# Patient Record
Sex: Female | Born: 1965 | Hispanic: No | Marital: Single | Smoking: Never smoker
Health system: Southern US, Community
[De-identification: ages and names within clinical notes are randomized; demographics above are authoritative.]

## PROBLEM LIST (undated history)

## (undated) DIAGNOSIS — Q619 Cystic kidney disease, unspecified: Secondary | ICD-10-CM

## (undated) DIAGNOSIS — Z87442 Personal history of urinary calculi: Secondary | ICD-10-CM

## (undated) DIAGNOSIS — D649 Anemia, unspecified: Secondary | ICD-10-CM

## (undated) DIAGNOSIS — R3129 Other microscopic hematuria: Secondary | ICD-10-CM

## (undated) HISTORY — DX: Cystic kidney disease, unspecified: Q61.9

## (undated) HISTORY — DX: Other microscopic hematuria: R31.29

## (undated) HISTORY — PX: COLONOSCOPY: SHX174

---

## 2002-10-29 ENCOUNTER — Other Ambulatory Visit: Admission: RE | Admit: 2002-10-29 | Discharge: 2002-10-29 | Payer: Self-pay | Admitting: Internal Medicine

## 2004-12-18 ENCOUNTER — Encounter: Admission: RE | Admit: 2004-12-18 | Discharge: 2004-12-18 | Payer: Self-pay | Admitting: Internal Medicine

## 2007-04-23 ENCOUNTER — Encounter: Payer: Self-pay | Admitting: Internal Medicine

## 2007-04-30 ENCOUNTER — Ambulatory Visit: Payer: Self-pay | Admitting: Internal Medicine

## 2007-05-08 ENCOUNTER — Ambulatory Visit: Payer: Self-pay | Admitting: Internal Medicine

## 2007-05-08 ENCOUNTER — Encounter: Payer: Self-pay | Admitting: Internal Medicine

## 2007-05-08 ENCOUNTER — Other Ambulatory Visit: Admission: RE | Admit: 2007-05-08 | Discharge: 2007-05-08 | Payer: Self-pay | Admitting: Internal Medicine

## 2007-05-08 DIAGNOSIS — R3129 Other microscopic hematuria: Secondary | ICD-10-CM

## 2007-05-08 LAB — CONVERTED CEMR LAB
Bilirubin Urine: NEGATIVE
Glucose, Urine, Semiquant: NEGATIVE
Ketones, urine, test strip: NEGATIVE
Nitrite: NEGATIVE
Protein, U semiquant: NEGATIVE
Urobilinogen, UA: NEGATIVE
WBC Urine, dipstick: NEGATIVE

## 2007-06-01 ENCOUNTER — Ambulatory Visit: Payer: Self-pay | Admitting: Internal Medicine

## 2007-06-01 ENCOUNTER — Telehealth: Payer: Self-pay | Admitting: Internal Medicine

## 2007-06-01 DIAGNOSIS — N390 Urinary tract infection, site not specified: Secondary | ICD-10-CM

## 2007-06-01 LAB — CONVERTED CEMR LAB
Bilirubin Urine: NEGATIVE
Glucose, Urine, Semiquant: NEGATIVE
Ketones, urine, test strip: NEGATIVE
Nitrite: NEGATIVE
Protein, U semiquant: NEGATIVE
Specific Gravity, Urine: 1.01
Urobilinogen, UA: 0.2
WBC Urine, dipstick: NEGATIVE
pH: 5.5

## 2007-06-11 ENCOUNTER — Telehealth: Payer: Self-pay | Admitting: *Deleted

## 2007-06-12 ENCOUNTER — Encounter: Payer: Self-pay | Admitting: Internal Medicine

## 2007-11-04 ENCOUNTER — Encounter: Admission: RE | Admit: 2007-11-04 | Discharge: 2007-11-04 | Payer: Self-pay | Admitting: Internal Medicine

## 2008-04-04 ENCOUNTER — Encounter: Payer: Self-pay | Admitting: Family Medicine

## 2008-04-05 ENCOUNTER — Ambulatory Visit: Payer: Self-pay | Admitting: Family Medicine

## 2008-04-05 DIAGNOSIS — Z9189 Other specified personal risk factors, not elsewhere classified: Secondary | ICD-10-CM

## 2008-08-01 ENCOUNTER — Ambulatory Visit: Payer: Self-pay | Admitting: Internal Medicine

## 2008-08-01 LAB — CONVERTED CEMR LAB
ALT: 22 units/L (ref 0–35)
AST: 30 units/L (ref 0–37)
Albumin: 3.9 g/dL (ref 3.5–5.2)
Alkaline Phosphatase: 45 units/L (ref 39–117)
BUN: 12 mg/dL (ref 6–23)
Basophils Absolute: 0.1 10*3/uL (ref 0.0–0.1)
Basophils Relative: 0.7 % (ref 0.0–3.0)
Bilirubin Urine: NEGATIVE
Bilirubin, Direct: 0.1 mg/dL (ref 0.0–0.3)
CO2: 28 meq/L (ref 19–32)
Calcium: 9.1 mg/dL (ref 8.4–10.5)
Chloride: 106 meq/L (ref 96–112)
Cholesterol: 176 mg/dL (ref 0–200)
Creatinine, Ser: 0.7 mg/dL (ref 0.4–1.2)
Eosinophils Absolute: 0.3 10*3/uL (ref 0.0–0.7)
Eosinophils Relative: 2.7 % (ref 0.0–5.0)
GFR calc non Af Amer: 96.55 mL/min (ref >60)
Glucose, Bld: 81 mg/dL (ref 70–99)
Glucose, Urine, Semiquant: NEGATIVE
HCT: 34.7 % — ABNORMAL LOW (ref 36.0–46.0)
HDL: 48.6 mg/dL (ref >39.00)
Hemoglobin: 12 g/dL (ref 12.0–15.0)
Ketones, urine, test strip: NEGATIVE
LDL Cholesterol: 106 mg/dL — ABNORMAL HIGH (ref 0–99)
Lymphocytes Relative: 18.2 % (ref 12.0–46.0)
Lymphs Abs: 1.7 10*3/uL (ref 0.7–4.0)
MCHC: 34.7 g/dL (ref 30.0–36.0)
MCV: 86.4 fL (ref 78.0–100.0)
Monocytes Absolute: 0.5 10*3/uL (ref 0.1–1.0)
Monocytes Relative: 5.4 % (ref 3.0–12.0)
Neutro Abs: 6.7 10*3/uL (ref 1.4–7.7)
Neutrophils Relative %: 73 % (ref 43.0–77.0)
Nitrite: NEGATIVE
Platelets: 292 10*3/uL (ref 150.0–400.0)
Potassium: 4.3 meq/L (ref 3.5–5.1)
Protein, U semiquant: NEGATIVE
RBC: 4.02 M/uL (ref 3.87–5.11)
RDW: 12.3 % (ref 11.5–14.6)
Sodium: 142 meq/L (ref 135–145)
Specific Gravity, Urine: <1.005
TSH: 1.68 microintl units/mL (ref 0.35–5.50)
Total Bilirubin: 0.8 mg/dL (ref 0.3–1.2)
Total CHOL/HDL Ratio: 4
Total Protein: 7.4 g/dL (ref 6.0–8.3)
Triglycerides: 105 mg/dL (ref 0.0–149.0)
Urobilinogen, UA: 0.2
VLDL: 21 mg/dL (ref 0.0–40.0)
WBC Urine, dipstick: NEGATIVE
WBC: 9.3 10*3/uL (ref 4.5–10.5)
pH: 5

## 2008-08-15 ENCOUNTER — Encounter: Payer: Self-pay | Admitting: Internal Medicine

## 2008-08-16 ENCOUNTER — Ambulatory Visit: Payer: Self-pay | Admitting: Internal Medicine

## 2008-08-16 DIAGNOSIS — E669 Obesity, unspecified: Secondary | ICD-10-CM | POA: Insufficient documentation

## 2008-08-16 LAB — CONVERTED CEMR LAB
Bilirubin Urine: NEGATIVE
Glucose, Urine, Semiquant: NEGATIVE
Ketones, urine, test strip: NEGATIVE
Nitrite: NEGATIVE
Protein, U semiquant: NEGATIVE
Specific Gravity, Urine: <1.005
Urobilinogen, UA: 0.2
WBC Urine, dipstick: NEGATIVE
pH: 6.5

## 2008-11-29 ENCOUNTER — Encounter: Admission: RE | Admit: 2008-11-29 | Discharge: 2008-11-29 | Payer: Self-pay | Admitting: Internal Medicine

## 2009-08-14 ENCOUNTER — Ambulatory Visit: Payer: Self-pay | Admitting: Internal Medicine

## 2009-08-14 LAB — CONVERTED CEMR LAB
ALT: 19 units/L (ref 0–35)
AST: 19 units/L (ref 0–37)
Albumin: 4.2 g/dL (ref 3.5–5.2)
Alkaline Phosphatase: 50 units/L (ref 39–117)
BUN: 20 mg/dL (ref 6–23)
Basophils Absolute: 0 10*3/uL (ref 0.0–0.1)
Basophils Relative: 0.6 % (ref 0.0–3.0)
Bilirubin Urine: NEGATIVE
Bilirubin, Direct: 0 mg/dL (ref 0.0–0.3)
CO2: 28 meq/L (ref 19–32)
Calcium: 9.5 mg/dL (ref 8.4–10.5)
Chloride: 107 meq/L (ref 96–112)
Cholesterol: 201 mg/dL — ABNORMAL HIGH (ref 0–200)
Creatinine, Ser: 0.6 mg/dL (ref 0.4–1.2)
Direct LDL: 133.1 mg/dL
Eosinophils Absolute: 0.2 10*3/uL (ref 0.0–0.7)
Eosinophils Relative: 3.2 % (ref 0.0–5.0)
GFR calc non Af Amer: 110.54 mL/min (ref >60)
Glucose, Bld: 87 mg/dL (ref 70–99)
HCT: 36.5 % (ref 36.0–46.0)
HDL: 51.3 mg/dL (ref >39.00)
Hemoglobin: 12.7 g/dL (ref 12.0–15.0)
Ketones, ur: NEGATIVE mg/dL
Leukocytes, UA: NEGATIVE
Lymphocytes Relative: 30.8 % (ref 12.0–46.0)
Lymphs Abs: 2.2 10*3/uL (ref 0.7–4.0)
MCHC: 34.9 g/dL (ref 30.0–36.0)
MCV: 85.5 fL (ref 78.0–100.0)
Monocytes Absolute: 0.4 10*3/uL (ref 0.1–1.0)
Monocytes Relative: 5.6 % (ref 3.0–12.0)
Neutro Abs: 4.2 10*3/uL (ref 1.4–7.7)
Neutrophils Relative %: 59.8 % (ref 43.0–77.0)
Nitrite: NEGATIVE
Platelets: 285 10*3/uL (ref 150.0–400.0)
Potassium: 4.6 meq/L (ref 3.5–5.1)
RBC: 4.26 M/uL (ref 3.87–5.11)
RDW: 13 % (ref 11.5–14.6)
Sodium: 139 meq/L (ref 135–145)
Specific Gravity, Urine: 1.01 (ref 1.000–1.030)
TSH: 1.37 microintl units/mL (ref 0.35–5.50)
Total Bilirubin: 0.6 mg/dL (ref 0.3–1.2)
Total CHOL/HDL Ratio: 4
Total Protein, Urine: NEGATIVE mg/dL
Total Protein: 7.4 g/dL (ref 6.0–8.3)
Triglycerides: 81 mg/dL (ref 0.0–149.0)
Urine Glucose: NEGATIVE mg/dL
Urobilinogen, UA: 0.2 (ref 0.0–1.0)
VLDL: 16.2 mg/dL (ref 0.0–40.0)
WBC: 7 10*3/uL (ref 4.5–10.5)
pH: 5.5 (ref 5.0–8.0)

## 2009-08-21 ENCOUNTER — Ambulatory Visit: Payer: Self-pay | Admitting: Internal Medicine

## 2009-08-21 ENCOUNTER — Other Ambulatory Visit: Admission: RE | Admit: 2009-08-21 | Discharge: 2009-08-21 | Payer: Self-pay | Admitting: Internal Medicine

## 2009-08-21 DIAGNOSIS — N281 Cyst of kidney, acquired: Secondary | ICD-10-CM | POA: Insufficient documentation

## 2009-08-21 LAB — HM PAP SMEAR

## 2009-08-24 LAB — CONVERTED CEMR LAB: Pap Smear: NEGATIVE

## 2009-10-05 ENCOUNTER — Encounter: Payer: Self-pay | Admitting: Internal Medicine

## 2009-12-08 ENCOUNTER — Encounter: Admission: RE | Admit: 2009-12-08 | Discharge: 2009-12-08 | Payer: Self-pay | Admitting: Internal Medicine

## 2009-12-08 LAB — HM MAMMOGRAPHY

## 2010-03-04 LAB — CONVERTED CEMR LAB
Bilirubin Urine: NEGATIVE
Glucose, Urine, Semiquant: NEGATIVE
Ketones, urine, test strip: NEGATIVE
Nitrite: NEGATIVE
Specific Gravity, Urine: 1.025
Urobilinogen, UA: 0.2
WBC Urine, dipstick: NEGATIVE
pH: 5.5

## 2010-03-06 NOTE — Assessment & Plan Note (Signed)
Summary: cpx/pap/njr   Vital Signs:  Patient profile:   45 year old female Menstrual status:  regular LMP:     08/07/2009 Height:      60.5 inches Weight:      206 pounds BMI:     39.71 Pulse rate:   72 / minute BP sitting:   100 / 70  (left arm) Cuff size:   large  Vitals Entered By: Romualdo Bolk, CMA (AAMA) (August 21, 2009 10:09 AM) CC: CPX with pap LMP (date): 08/07/2009 LMP - Character: normal Menarche (age onset years): 12   Menses interval (days): 28 Menstrual flow (days): 3-4 Enter LMP: 08/07/2009   History of Present Illness: Carrie Caldwell comes in today  for preventive visit . Since last visit  here  there have been no major changes in health status  . No ed visit s. Due for check with Urology [facility] ? should she go. No signs and no gross hematuria.   Preventive Care Screening  Prior Values:    Mammogram:  ASSESSMENT: Negative - BI-RADS 1^MM DIGITAL SCREENING (11/29/2008)    Last Tetanus Booster:  Tdap (05/08/2007)   Preventive Screening-Counseling & Management  Alcohol-Tobacco     Alcohol drinks/day: <1     Alcohol type: wine     Smoking Status: never  Caffeine-Diet-Exercise     Caffeine use/day: 2     Does Patient Exercise: yes     Type of exercise: eliptical and wts     Times/week: 5  Hep-HIV-STD-Contraception     Dental Visit-last 6 months yes     Sun Exposure-Excessive: no  Safety-Violence-Falls     Seat Belt Use: yes     Smoke Detectors: yes  Current Medications (verified): 1)  None  Allergies (verified): No Known Drug Allergies  Past History:  Past medical, surgical, family and social histories (including risk factors) reviewed, and no changes noted (except as noted below).  Past Medical History: Unremarkable primiiparous Microscopic hematuria  Cystic kidney  left   Past Surgical History: Reviewed history from 05/08/2007 and no changes required. Denies surgical history  Past History:  Care Management: Urology:  [facility]  Family History: Reviewed history from 05/08/2007 and no changes required. Family Hx of CAD and DM  kidney stones  otherwise no hematuria  Social History: Reviewed history from 08/16/2008 and no changes required. 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 exercises every am.5 hours of sleep hh of 2  mom and her  pet outside dogs.  2 dogs  Review of Systems  The patient denies anorexia, fever, weight loss, weight gain, vision loss, decreased hearing, hoarseness, chest pain, syncope, dyspnea on exertion, peripheral edema, prolonged cough, headaches, hemoptysis, abdominal pain, melena, hematochezia, severe indigestion/heartburn, incontinence, genital sores, muscle weakness, suspicious skin lesions, transient blindness, difficulty walking, depression, unusual weight change, abnormal bleeding, enlarged lymph nodes, angioedema, and breast masses.         periods  sometimes q  3 weeks nl length .   Physical Exam General Appearance: well developed, well nourished, no acute distress Eyes: conjunctiva and lids normal, PERRLA, EOMI,  WNL Ears, Nose, Mouth, Throat: TM clear, nares clear, oral exam WNL Neck: supple, no lymphadenopathy, no thyromegaly, no JVD Respiratory: clear to auscultation and percussion, respiratory effort normal Cardiovascular: regular rate and rhythm, S1-S2, no murmur, rub or gallop, no bruits, peripheral pulses normal and symmetric, no cyanosis, clubbing, edema or varicosities Chest: no scars, masses,  tenderness; no asymmetry, skin changes, nipple discharge   Gastrointestinal: soft, non-tender; no hepatosplenomegaly, masses; active bowel sounds all quadrants, guaiac negative stool; no masses, tenderness, hemorrhoids  Genitourinary: no vaginal discharge, lesions; no masses or tenderness  PAP done  Lymphatic: no cervical, axillary or inguinal adenopathy Musculoskeletal: gait  normal, muscle tone and strength WNL, no joint swelling, effusions, discoloration, crepitus  Skin: clear, good turgor, color WNL, no rashes, lesions, or ulcerations Neurologic: normal mental status, normal reflexes, normal strength, sensation, and motion Psychiatric: alert; oriented to person, place and time Other Exam:   labs reviewed     Impression & Recommendations:  Problem # 1:  PREVENTIVE HEALTH CARE (ICD-V70.0) Discussed nutrition,exercise,diet,healthy weight, vitamin D and calcium.   Problem # 2:  ROUTINE GYNECOLOGICAL EXAM (ICD-V72.31)  nl exam  pap done   Orders: Pap Smear, Thin Prep ( Collection of) ([id number])  Problem # 3:  OBESITY (ICD-278.00)  counseled   famil hx of dm   Ht: 60.5 (08/21/2009)   Wt: 206 (08/21/2009)   BMI: 39.71 (08/21/2009)  Problem # 4:  MICROSCOPIC HEMATURIA (ICD-599.72) continued and would follow up with  urology  .   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  3)  See urology  about follow up of hematuria .Marland KitchenMarland Kitchen

## 2010-03-06 NOTE — Letter (Signed)
Summary: [facility]  [facility]   Imported By: Maryln Gottron 10/17/2009 11:27:40  _____________________________________________________________________  External Attachment:    Type:   Image     Comment:   External Document

## 2010-11-01 ENCOUNTER — Other Ambulatory Visit (HOSPITAL_COMMUNITY)
Admission: RE | Admit: 2010-11-01 | Discharge: 2010-11-01 | Disposition: A | Discharge status: Home / Self Care | Payer: BC Managed Care – PPO | Source: Ambulatory Visit | Attending: Family Medicine | Admitting: Family Medicine

## 2010-11-01 ENCOUNTER — Ambulatory Visit (INDEPENDENT_AMBULATORY_CARE_PROVIDER_SITE_OTHER): Payer: BC Managed Care – PPO | Admitting: Family Medicine

## 2010-11-01 ENCOUNTER — Encounter: Payer: Self-pay | Admitting: Family Medicine

## 2010-11-01 ENCOUNTER — Telehealth: Payer: Self-pay | Admitting: Internal Medicine

## 2010-11-01 VITALS — BP 110/78 | Temp 98.3°F | Wt 185.0 lb

## 2010-11-01 DIAGNOSIS — N939 Abnormal uterine and vaginal bleeding, unspecified: Secondary | ICD-10-CM

## 2010-11-01 DIAGNOSIS — Z01419 Encounter for gynecological examination (general) (routine) without abnormal findings: Secondary | ICD-10-CM | POA: Insufficient documentation

## 2010-11-01 DIAGNOSIS — N898 Other specified noninflammatory disorders of vagina: Secondary | ICD-10-CM

## 2010-11-01 DIAGNOSIS — R319 Hematuria, unspecified: Secondary | ICD-10-CM

## 2010-11-01 DIAGNOSIS — N888 Other specified noninflammatory disorders of cervix uteri: Secondary | ICD-10-CM

## 2010-11-01 LAB — POCT URINALYSIS DIPSTICK
Bilirubin, UA: NEGATIVE
Glucose, UA: NEGATIVE
Ketones, UA: NEGATIVE
Leukocytes, UA: NEGATIVE
Nitrite, UA: NEGATIVE
Protein, UA: NEGATIVE
Spec Grav, UA: 1.01
Urobilinogen, UA: 0.2
pH, UA: 6

## 2010-11-01 NOTE — Progress Notes (Signed)
  Subjective:    Patient ID: Carrie Caldwell, female    DOB: Apr 01, 1965, 45 y.o.   MRN: [id number]  HPI Patient seen as a work in with some vaginal bleeding with wiping-after urination. She had end of last menstrual cycle Monday and has not had any vaginal bleeding otherwise. Denies rectal bleeding. She has a long history of microscopic hematuria and is seeing urologist with full workup. She's had previous cystic lesions left kidney followed yearly by urology.  No gross hematuria.  No history of abnormal Pap smears. Last Pap smear was over one year ago. Denies any appetite or weight changes. No vaginal discharge. Bright blood with wiping after urination today. No other bleeding complications.  Past Medical History  Diagnosis Date  . Microscopic hematuria   . Cystic kidney disease     left   No past surgical history on file.  reports that she has never smoked. She does not have any smokeless tobacco history on file. Her alcohol and drug histories not on file. family history includes Coronary artery disease in her other; Diabetes in her other; Hematuria in her other; and Kidney disease in her other. No Known Allergies    Review of Systems  Constitutional: Negative for fever, chills, appetite change and unexpected weight change.  Respiratory: Negative for shortness of breath.   Cardiovascular: Negative for chest pain.  Gastrointestinal: Negative for abdominal pain.  Genitourinary: Positive for vaginal bleeding. Negative for dysuria, hematuria, vaginal discharge, vaginal pain and pelvic pain.  Neurological: Negative for dizziness.       Objective:   Physical Exam  Constitutional: She appears well-developed and well-nourished.  Cardiovascular: Normal rate and regular rhythm.   Pulmonary/Chest: Effort normal and breath sounds normal. No respiratory distress. She has no wheezes. She has no rales.  Genitourinary:       Normal external genitalia. No evidence for active bleeding, external  trauma, or  external skin lesions.  Cervix exam reveals that she has nodular somewhat vascular appearing lesion which is approximately 1 cm diameter around 11 to 12:00 position of cervix. No endocervical polyps are noted. No active bleeding at this time. Pap smear obtained          Assessment & Plan:  Abnormal vaginal bleeding. She's had light spotting with wiping only at this point. Exam reveals vascular appearing nodular lesion superior aspect of the ectocervix-? Hemangiomatous.  Pap smear sent. GYN referral.

## 2010-11-01 NOTE — Telephone Encounter (Signed)
Pt to come in today for ov.

## 2010-11-01 NOTE — Telephone Encounter (Signed)
Pt is still having blood in urine pt said this is a reoccurring issue she has had for year it is just more frequent now. Pt received a notice for go back for a follow up ultra sound. Pt wanted to know if she should wait and go to this appt or come see Dr. Fabian Sharp.  Pt is requesting you contact her.

## 2010-11-07 NOTE — Progress Notes (Signed)
Quick Note:  Pt informed ______ 

## 2010-11-27 ENCOUNTER — Other Ambulatory Visit: Payer: Self-pay | Admitting: Internal Medicine

## 2010-11-27 DIAGNOSIS — Z1231 Encounter for screening mammogram for malignant neoplasm of breast: Secondary | ICD-10-CM

## 2010-12-10 ENCOUNTER — Ambulatory Visit
Admission: RE | Admit: 2010-12-10 | Discharge: 2010-12-10 | Disposition: A | Discharge status: Home / Self Care | Payer: BC Managed Care – PPO | Source: Ambulatory Visit | Attending: Internal Medicine | Admitting: Internal Medicine

## 2010-12-10 DIAGNOSIS — Z1231 Encounter for screening mammogram for malignant neoplasm of breast: Secondary | ICD-10-CM

## 2011-04-22 ENCOUNTER — Ambulatory Visit (INDEPENDENT_AMBULATORY_CARE_PROVIDER_SITE_OTHER): Payer: BC Managed Care – PPO | Admitting: Internal Medicine

## 2011-04-22 ENCOUNTER — Encounter: Payer: Self-pay | Admitting: Internal Medicine

## 2011-04-22 VITALS — BP 100/70 | HR 65 | Temp 98.0°F | Wt 172.0 lb

## 2011-04-22 DIAGNOSIS — J069 Acute upper respiratory infection, unspecified: Secondary | ICD-10-CM

## 2011-04-22 DIAGNOSIS — E669 Obesity, unspecified: Secondary | ICD-10-CM

## 2011-04-22 DIAGNOSIS — H6590 Unspecified nonsuppurative otitis media, unspecified ear: Secondary | ICD-10-CM

## 2011-04-22 MED ORDER — AMOXICILLIN 875 MG PO TABS: 875.0000 mg | ORAL_TABLET | Freq: Two times a day (BID) | ORAL | Status: AC

## 2011-04-22 NOTE — Progress Notes (Signed)
  Subjective:    Patient ID: Carrie Caldwell, female    DOB: [birth year], 46 y.o.   MRN: [id number]  HPI Patient comes in today for SDA for  new problem evaluation. Glenford Peers about 2 weeks ago  Then developed ear pain a few days ago  and hard to hear   Out of left ear .  Has used Tylenol advil cold ;  Drops otc and no help ;   Cough at night  No fever.  no SOB no hx of ear problems    Review of Systems Cough hs im sleep  No ha vision changes  Nasal drainage  Vomiting syncope sob .   Has been losing weight over the last year. No more hematuria  ? Didn't fu with the urologist but doing ok.   Past history family history social history reviewed in the electronic medical record.     Objective:   Physical Exam wdwn in nad BP 100/70  Pulse 65  Temp(Src) 98 F (36.7 C) (Oral)  Wt 172 lb (78.019 kg)  SpO2 97%  LMP 03/08/2011 HEENT: Normocephalic ;atraumatic , Eyes;  PERRL, EOMs  Full, lids and conjunctiva clear,,Ears: no deformities, canals nl, TM landmarks normal on right   LEFT  TM splayed light reflex    Fluid dusky and bulging inferior   Bones are ok  eac nl , Nose: no deformity or discharge  Mouth : OP clear without lesion or edema . Neck: Supple without adenopathy or masses or bruits Chest:  Clear to A&P without wheezes rales or rhonchi CV:  S1-S2 no gallops or murmurs peripheral perfusion is normal Chest roundish  Cartilage feeling left lower rib cage  ? Cartilage   Wt Readings from Last 3 Encounters:  04/22/11 172 lb (78.019 kg)  11/01/10 185 lb (83.915 kg)  08/21/09 206 lb (93.441 kg)       Assessment & Plan:  Acute uri with secondary Om with effusion.   Counseled.   Expectant management. Reasonable to use antibiotic at this time . Fu if  persistent or progressive   congrats on weight loss    Continue   reviewed other HCM parameters and is UTD

## 2011-04-22 NOTE — Patient Instructions (Signed)
You have had an ear infection and fluid in the middle ear space makes her hearing decrease. This should   Get better over weeks.You may benefit from antibiotic treatment if not getting better .   Decongestants in the day. You do not have was in your ear. .  I f  not hearing right in another 2 - 3 weeks call or if worse.

## 2011-04-27 DIAGNOSIS — H6590 Unspecified nonsuppurative otitis media, unspecified ear: Secondary | ICD-10-CM | POA: Insufficient documentation

## 2011-12-11 ENCOUNTER — Other Ambulatory Visit: Payer: Self-pay | Admitting: Internal Medicine

## 2011-12-11 DIAGNOSIS — Z1231 Encounter for screening mammogram for malignant neoplasm of breast: Secondary | ICD-10-CM

## 2012-01-21 ENCOUNTER — Ambulatory Visit
Admission: RE | Admit: 2012-01-21 | Discharge: 2012-01-21 | Disposition: A | Discharge status: Home / Self Care | Payer: BC Managed Care – PPO | Source: Ambulatory Visit | Attending: Internal Medicine | Admitting: Internal Medicine

## 2012-01-21 DIAGNOSIS — Z1231 Encounter for screening mammogram for malignant neoplasm of breast: Secondary | ICD-10-CM

## 2013-12-07 ENCOUNTER — Other Ambulatory Visit: Payer: Self-pay

## 2013-12-07 DIAGNOSIS — Z1231 Encounter for screening mammogram for malignant neoplasm of breast: Secondary | ICD-10-CM

## 2013-12-21 ENCOUNTER — Encounter (INDEPENDENT_AMBULATORY_CARE_PROVIDER_SITE_OTHER): Payer: Self-pay

## 2013-12-21 ENCOUNTER — Ambulatory Visit
Admission: RE | Admit: 2013-12-21 | Discharge: 2013-12-21 | Disposition: A | Discharge status: Home / Self Care | Payer: BC Managed Care – PPO | Source: Ambulatory Visit

## 2013-12-21 DIAGNOSIS — Z1231 Encounter for screening mammogram for malignant neoplasm of breast: Secondary | ICD-10-CM

## 2014-11-09 ENCOUNTER — Ambulatory Visit: Payer: Self-pay | Admitting: Internal Medicine

## 2015-01-09 ENCOUNTER — Other Ambulatory Visit: Payer: Self-pay

## 2015-01-09 DIAGNOSIS — Z1231 Encounter for screening mammogram for malignant neoplasm of breast: Secondary | ICD-10-CM

## 2015-02-07 ENCOUNTER — Ambulatory Visit
Admission: RE | Admit: 2015-02-07 | Discharge: 2015-02-07 | Disposition: A | Discharge status: Home / Self Care | Payer: BLUE CROSS/BLUE SHIELD | Source: Ambulatory Visit

## 2015-02-07 DIAGNOSIS — Z1231 Encounter for screening mammogram for malignant neoplasm of breast: Secondary | ICD-10-CM

## 2016-02-26 ENCOUNTER — Other Ambulatory Visit (INDEPENDENT_AMBULATORY_CARE_PROVIDER_SITE_OTHER): Payer: Self-pay | Admitting: Orthopedic Surgery

## 2016-09-09 ENCOUNTER — Other Ambulatory Visit: Payer: Self-pay | Admitting: Family Medicine

## 2017-09-02 ENCOUNTER — Encounter (INDEPENDENT_AMBULATORY_CARE_PROVIDER_SITE_OTHER): Payer: Self-pay | Admitting: Orthopedic Surgery

## 2017-09-02 ENCOUNTER — Ambulatory Visit (INDEPENDENT_AMBULATORY_CARE_PROVIDER_SITE_OTHER): Payer: Self-pay

## 2017-09-02 ENCOUNTER — Ambulatory Visit (INDEPENDENT_AMBULATORY_CARE_PROVIDER_SITE_OTHER): Payer: BLUE CROSS/BLUE SHIELD | Admitting: Orthopedic Surgery

## 2017-09-02 VITALS — Ht 60.5 in | Wt 172.0 lb

## 2017-09-02 DIAGNOSIS — M5442 Lumbago with sciatica, left side: Secondary | ICD-10-CM

## 2017-09-02 MED ORDER — PREDNISONE 10 MG PO TABS: 20.0000 mg | ORAL_TABLET | Freq: Every day | ORAL | 0 refills | Status: DC

## 2017-09-02 NOTE — Addendum Note (Signed)
Addended by: Aldean BakerUDA, Elica Almas on: 09/02/2017 11:02 AM   Modules accepted: Level of Service

## 2017-09-02 NOTE — Progress Notes (Signed)
Office Visit Note   Patient: Carrie Caldwell           Date of Birth: 26-Mar-1965           MRN: [id number] Visit Date: 09/02/2017              Requested by: Madelin Headings, MD [address] [place], [place] [address] PCP: Eartha Inch, MD  Chief Complaint  Patient presents with  . Left Knee - Pain, Numbness  . Left Foot - Pain      HPI: Patient is a 52 year old woman who presents with over 6-week history of left buttocks pain radiating to the lateral aspect the left knee and also radiating down to the medial malleolus.  Patient states that the pain is worse with prolonged sitting.  Patient has used Tylenol or Advil for pain without resolution.  Assessment & Plan: Visit Diagnoses:  1. Acute left-sided low back pain with left-sided sciatica     Plan: We will start with a low-dose prednisone 20 mg with breakfast she will wean down to 10 mg as symptoms resolved and then take this every other day and then discontinue to wean off the prednisone.  If patient is still symptomatic will schedule an MRI scan to further evaluate the lumbar disc pathology with intentions of referral to Dr. Alvester Morin for epidural steroid injection.  Follow-Up Instructions: Return if symptoms worsen or fail to improve.   Ortho Exam  Patient is alert, oriented, no adenopathy, well-dressed, normal affect, normal respiratory effort. Examination patient has a normal gait.  She has no pain with range of motion of the hip knee or ankle.  There is good motor strength in all motor groups of the left lower extremity.  Patient has a negative straight leg raise on the left.  Imaging: Xr Lumbar Spine 2-3 Views  Result Date: 09/02/2017 2 view radiographs of the lumbar spine show some mild degenerative disc disease with some mild osteophytic bone spurring.   maintenance of the lumbar lordosis.  No images are attached to the encounter.  Labs: No results found for: HGBA1C, ESRSEDRATE, CRP, LABURIC, REPTSTATUS,  GRAMSTAIN, CULT, LABORGA   Lab Results  Component Value Date   ALBUMIN 4.2 08/14/2009   ALBUMIN 3.9 08/01/2008    Body mass index is 33.04 kg/m.  Orders:  Orders Placed This Encounter  Procedures  . XR Lumbar Spine 2-3 Views   Meds ordered this encounter  Medications  . predniSONE (DELTASONE) 10 MG tablet    Sig: Take 2 tablets (20 mg total) by mouth daily with breakfast.    Dispense:  60 tablet    Refill:  0     Procedures: No procedures performed  Clinical Data: No additional findings.  ROS:  All other systems negative, except as noted in the HPI. Review of Systems  Objective: Vital Signs: Ht 5' 0.5" (1.537 m)   Wt 172 lb (78 kg)   BMI 33.04 kg/m   Specialty Comments:  No specialty comments available.  PMFS History: Patient Active Problem List   Diagnosis Date Noted  . OME (otitis media with effusion) 04/27/2011  . RENAL CYST, LEFT 08/21/2009  . OBESITY 08/16/2008  . CHEST WALL PAIN, HX OF 04/05/2008  . MICROSCOPIC HEMATURIA 05/08/2007   Past Medical History:  Diagnosis Date  . Cystic kidney disease    left  . Microscopic hematuria     Family History  Problem Relation Age of Onset  . Diabetes Other   . Coronary  artery disease Other   . Kidney disease Other   . Hematuria Other     History reviewed. No pertinent surgical history. Social History   Occupational History  . Not on file  Tobacco Use  . Smoking status: Never Smoker  Substance and Sexual Activity  . Alcohol use: Not on file  . Drug use: Not on file  . Sexual activity: Not on file

## 2017-10-09 ENCOUNTER — Ambulatory Visit (INDEPENDENT_AMBULATORY_CARE_PROVIDER_SITE_OTHER): Payer: BLUE CROSS/BLUE SHIELD | Admitting: Orthopedic Surgery

## 2017-10-14 ENCOUNTER — Encounter (INDEPENDENT_AMBULATORY_CARE_PROVIDER_SITE_OTHER): Payer: Self-pay | Admitting: Orthopedic Surgery

## 2017-10-14 ENCOUNTER — Ambulatory Visit (INDEPENDENT_AMBULATORY_CARE_PROVIDER_SITE_OTHER): Payer: BLUE CROSS/BLUE SHIELD | Admitting: Orthopedic Surgery

## 2017-10-14 VITALS — Ht 60.0 in | Wt 172.0 lb

## 2017-10-14 DIAGNOSIS — M76822 Posterior tibial tendinitis, left leg: Secondary | ICD-10-CM

## 2017-10-14 DIAGNOSIS — M5442 Lumbago with sciatica, left side: Secondary | ICD-10-CM | POA: Diagnosis not present

## 2017-10-14 NOTE — Progress Notes (Signed)
Office Visit Note   Patient: Carrie Caldwell           Date of Birth: February 11, 1965           MRN: [id number] Visit Date: 10/14/2017              Requested by: Eartha Inch, MD [address] [place], [place] [address] PCP: Eartha Inch, MD  Chief Complaint  Patient presents with  . Lower Back - Follow-up      HPI: Patient is a 52 year old woman who has been having lower back pain as well as pain over the posterior tibial tendon on the left.  She states the prednisone taper has helped but she states is currently not helping as much as it was on the stronger dose.  She denies any weakness in the left lower extremity.  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  Assessment & Plan: Visit Diagnoses:  1. Acute left-sided low back pain with left-sided sciatica   2. Posterior tibial tendinitis, left leg     Plan: She was given felt pads to provide support for the posterior tibial tendon recommended sitting to minimize weightbearing on the lower extremity.  Her back pain does not have any radicular complaints at this time and will see if this resolves on its own.  Follow-Up Instructions: Return if symptoms worsen or fail to improve.   Ortho Exam  Patient is alert, oriented, no adenopathy, well-dressed, normal affect, normal respiratory effort. Patient has normal gait she can do a single limb heel raise but this reproduces her pain on the left she has pain to palpation over the posterior tibial tendon and has pain with resisted inversion is all reproduces the same symptoms.  She has good pulses good ankle good subtalar motion.  She has a negative straight leg raise and no focal motor weakness on the left lower extremity.  Imaging: No results found. No images are attached to the encounter.  Labs: No results found for: HGBA1C, ESRSEDRATE, CRP, LABURIC, REPTSTATUS, GRAMSTAIN, CULT, LABORGA   Lab Results  Component Value Date   ALBUMIN 4.2 08/14/2009   ALBUMIN 3.9 08/01/2008    Body mass index is 33.59 kg/m.  Orders:  No orders of the defined types were placed in this encounter.  No orders of the defined types were placed in this encounter.    Procedures: No procedures performed  Clinical Data: No additional findings.  ROS:  All other systems negative, except as noted in the HPI. Review of Systems  Objective: Vital Signs: Ht 5' (1.524 m)   Wt 172 lb (78 kg)   BMI 33.59 kg/m   Specialty Comments:  No specialty comments available.  PMFS History: Patient Active Problem List   Diagnosis Date Noted  . OME (otitis media with effusion) 04/27/2011  . RENAL CYST, LEFT 08/21/2009  . OBESITY 08/16/2008  . CHEST WALL PAIN, HX OF 04/05/2008  . MICROSCOPIC HEMATURIA 05/08/2007   Past Medical History:  Diagnosis Date  . Cystic kidney disease    left  . Microscopic hematuria     Family History  Problem Relation Age of Onset  . Diabetes Other   . Coronary artery disease Other   . Kidney disease Other   . Hematuria Other     History reviewed. No pertinent surgical history. Social History   Occupational History  . Not on file  Tobacco Use  . Smoking status: Never Smoker  .  Smokeless tobacco: Never Used  Substance and Sexual Activity  . Alcohol use: Not on file  . Drug use: Not on file  . Sexual activity: Not on file

## 2017-12-25 ENCOUNTER — Other Ambulatory Visit (INDEPENDENT_AMBULATORY_CARE_PROVIDER_SITE_OTHER): Payer: Self-pay

## 2017-12-25 MED ORDER — PREDNISONE 10 MG PO TABS: 20.0000 mg | ORAL_TABLET | Freq: Every day | ORAL | 0 refills | Status: DC

## 2018-07-20 ENCOUNTER — Ambulatory Visit (INDEPENDENT_AMBULATORY_CARE_PROVIDER_SITE_OTHER): Payer: BC Managed Care – PPO | Admitting: Orthopedic Surgery

## 2018-07-20 ENCOUNTER — Ambulatory Visit: Payer: Self-pay

## 2018-07-20 ENCOUNTER — Other Ambulatory Visit: Payer: Self-pay

## 2018-07-20 ENCOUNTER — Encounter: Payer: Self-pay | Admitting: Orthopedic Surgery

## 2018-07-20 VITALS — Ht 60.0 in | Wt 172.0 lb

## 2018-07-20 DIAGNOSIS — M79671 Pain in right foot: Secondary | ICD-10-CM

## 2018-07-20 DIAGNOSIS — L97511 Non-pressure chronic ulcer of other part of right foot limited to breakdown of skin: Secondary | ICD-10-CM

## 2018-07-20 DIAGNOSIS — M7989 Other specified soft tissue disorders: Secondary | ICD-10-CM

## 2018-07-20 DIAGNOSIS — B029 Zoster without complications: Secondary | ICD-10-CM

## 2018-07-20 DIAGNOSIS — R2240 Localized swelling, mass and lump, unspecified lower limb: Secondary | ICD-10-CM

## 2018-07-20 DIAGNOSIS — B019 Varicella without complication: Secondary | ICD-10-CM

## 2018-07-20 MED ORDER — VALACYCLOVIR HCL 1 G PO TABS: 1000.0000 mg | ORAL_TABLET | Freq: Three times a day (TID) | ORAL | 0 refills | Status: DC

## 2018-07-20 NOTE — Progress Notes (Signed)
Office Visit Note   Patient: Carrie Caldwell           Date of Birth: 1965/10/20           MRN: [id number] Visit Date: 07/20/2018              Requested by: Chesley Noon, MD [address] [place],   [address] PCP: Chesley Noon, MD  Chief Complaint  Patient presents with  . Right Foot - Pain      HPI: Patient is a 53 year old woman who presents with a 2-monthhistory of a blister and now ulcer on the dorsum of the right foot between the first and second metatarsal.  Patient states she initially went to dermatology she had the blister popped she states that there were pathology results that were negative.  She states that the ulcer is getting worse she has clear weeping drainage.  She is using antibiotic ointment on the ulcer.  Patient also has a 1 year history of dry cracked skin on her fingers.  Patient denies history of rheumatoid arthritis in her family.  Assessment & Plan: Visit Diagnoses:  1. Right foot pain   2. Non-pressure chronic ulcer of other part of right foot limited to breakdown of skin (HCC)   3. Varicella zoster   4. Mass of soft tissue of foot     Plan: Patient will wear the medical compression stockings directly against the skin she will stop the antibiotics and stop the antibiotic ointment.  We will obtain an MRI scan of the right foot to further evaluate the soft tissue mass and we will draw the rheumatologic screen.  Follow-Up Instructions: Return in about 1 week (around 07/27/2018).   Ortho Exam  Patient is alert, oriented, no adenopathy, well-dressed, normal affect, normal respiratory effort. On examination patient has a good dorsalis pedis pulse she does not have any venous stasis changes consistent with venous insufficiency.  Patient does have scleroderma like changes of the skin of her fingers  with dry cracked skin of her fingers.  There is no swelling of the PIP or DIP joints and no ulnar deviation across the MCP joints.  Examination  of the right foot she has no range of motion at the MTP joint consistent with the radiographic hallux rigidus.  Patient does have what appears to be a soft mobile soft tissue mass under the ulcer this measures 2 cm in diameter.  It does not appear to be connected to the EHL tendon or lesser tendons of her foot.  There is clear weeping edema she states that after she started wearing the sock the ulcerative base is improving.  There is no exposed bone or tendon no cellulitis.  There is a small area of dermatitis around the ulcer which is most likely due to the antibiotic ointment and the drainage.  The ulcerative area is extremely painful to touch.  Imaging: Xr Foot Complete Right  Result Date: 07/20/2018 3 view radiographs of the right foot shows hallux rigidus with joint space collapse of the great toe MTP joint.  No other bony abnormalities no soft tissue mass visible    Labs: No results found for: HGBA1C, ESRSEDRATE, CRP, LABURIC, REPTSTATUS, GRAMSTAIN, CULT, LABORGA   Lab Results  Component Value Date   ALBUMIN 4.2 08/14/2009   ALBUMIN 3.9 08/01/2008    Body mass index is 33.59 kg/m.  Orders:  Orders Placed This Encounter  Procedures  . XR Foot Complete Right  . Sed  Rate (ESR)  . Uric acid  . ANA  . Rheumatoid Factor   No orders of the defined types were placed in this encounter.    Procedures: No procedures performed  Clinical Data: No additional findings.  ROS:  All other systems negative, except as noted in the HPI. Review of Systems  Objective: Vital Signs: Ht 5' (1.524 m)   Wt 172 lb (78 kg)   BMI 33.59 kg/m   Specialty Comments:  No specialty comments available.  PMFS History: Patient Active Problem List   Diagnosis Date Noted  . Mass of soft tissue of foot 07/20/2018  . OME (otitis media with effusion) 04/27/2011  . RENAL CYST, LEFT 08/21/2009  . OBESITY 08/16/2008  . CHEST WALL PAIN, HX OF 04/05/2008  . MICROSCOPIC HEMATURIA 05/08/2007    Past Medical History:  Diagnosis Date  . Cystic kidney disease    left  . Microscopic hematuria     Family History  Problem Relation Age of Onset  . Diabetes Other   . Coronary artery disease Other   . Kidney disease Other   . Hematuria Other     History reviewed. No pertinent surgical history. Social History   Occupational History  . Not on file  Tobacco Use  . Smoking status: Never Smoker  . Smokeless tobacco: Never Used  Substance and Sexual Activity  . Alcohol use: Not on file  . Drug use: Not on file  . Sexual activity: Not on file

## 2018-07-21 ENCOUNTER — Telehealth: Payer: Self-pay | Admitting: Orthopedic Surgery

## 2018-07-21 ENCOUNTER — Other Ambulatory Visit: Payer: Self-pay | Admitting: Orthopedic Surgery

## 2018-07-21 LAB — URIC ACID: Uric Acid, Serum: 7.2 mg/dL — ABNORMAL HIGH (ref 2.5–7.0)

## 2018-07-21 LAB — SEDIMENTATION RATE: Sed Rate: 36 mm/h — ABNORMAL HIGH (ref 0–30)

## 2018-07-21 LAB — RHEUMATOID FACTOR: Rheumatoid fact SerPl-aCnc: <14 IU/mL (ref <14)

## 2018-07-21 NOTE — Telephone Encounter (Signed)
I called patient and informed her that her uric acid was 7.2.  Patient states that she does have kidney disease so colchicine would not be a good option.  Will give her samples of Uloric 40 mg daily.

## 2018-07-23 LAB — ANA: ANA Titer 1: NEGATIVE

## 2018-07-27 ENCOUNTER — Ambulatory Visit (INDEPENDENT_AMBULATORY_CARE_PROVIDER_SITE_OTHER): Payer: BC Managed Care – PPO | Admitting: Orthopedic Surgery

## 2018-07-27 ENCOUNTER — Telehealth: Payer: Self-pay | Admitting: Radiology

## 2018-07-27 ENCOUNTER — Other Ambulatory Visit: Payer: Self-pay

## 2018-07-27 ENCOUNTER — Encounter: Payer: Self-pay | Admitting: Orthopedic Surgery

## 2018-07-27 VITALS — Ht 60.0 in | Wt 172.0 lb

## 2018-07-27 DIAGNOSIS — L97511 Non-pressure chronic ulcer of other part of right foot limited to breakdown of skin: Secondary | ICD-10-CM

## 2018-07-27 NOTE — Telephone Encounter (Signed)
Patient's mother, Joycelyn Schmid, called with concerns about her daughter. She does not want her daughter to know that she is calling. She is concerned that the MRI is not being done quickly enough and wants to know if Dr. Sharol Given can get it done quicker. I advised imaging centers are trying to schedule new patient referrals as well as rescheduling all MRI's that were cancelled over the past 3 months due to COVID-19.  She expressed understanding, but would still like the message to be sent to Dr. Sharol Given to see if it can be done sooner.

## 2018-07-27 NOTE — Telephone Encounter (Signed)
Please see message below. Is there anyway we can call and move appt for MRI up or is this the first available?

## 2018-07-28 ENCOUNTER — Telehealth: Payer: Self-pay

## 2018-07-28 NOTE — Telephone Encounter (Signed)
I called Joycelyn Schmid back no answer left vm

## 2018-07-28 NOTE — Telephone Encounter (Signed)
I Called and left message to return my call 

## 2018-07-29 ENCOUNTER — Ambulatory Visit
Admission: RE | Admit: 2018-07-29 | Discharge: 2018-07-29 | Disposition: A | Discharge status: Home / Self Care | Payer: BC Managed Care – PPO | Source: Ambulatory Visit | Attending: Orthopedic Surgery | Admitting: Orthopedic Surgery

## 2018-07-29 ENCOUNTER — Other Ambulatory Visit: Payer: Self-pay

## 2018-07-29 DIAGNOSIS — M7989 Other specified soft tissue disorders: Secondary | ICD-10-CM

## 2018-07-30 ENCOUNTER — Encounter: Payer: Self-pay | Admitting: Orthopedic Surgery

## 2018-07-30 ENCOUNTER — Other Ambulatory Visit: Payer: BC Managed Care – PPO

## 2018-07-30 NOTE — Progress Notes (Signed)
   Office Visit Note   Patient: Carrie Caldwell           Date of Birth: 1965-07-15           MRN: [id number] Visit Date: 07/27/2018              Requested by: Chesley Noon, MD [address] [place],  [place] [address] PCP: Chesley Noon, MD  Chief Complaint  Patient presents with  . Right Foot - Pain, Follow-up      HPI: Patient presents in follow-up for a ulcer dorsum of the right foot.  Wound is slowly improving with the medical compression sock it is not healing as rapidly as would be expected.  Assessment & Plan: Visit Diagnoses:  1. Non-pressure chronic ulcer of other part of right foot limited to breakdown of skin [facility])     Plan: Patient is still scheduled for an MRI scan to further evaluate the soft tissues she will continue with the medical compression stocking.  Follow-Up Instructions: Return if symptoms worsen or fail to improve.   Ortho Exam  Patient is alert, oriented, no adenopathy, well-dressed, normal affect, normal respiratory effort. Examination the wound does look a little better.  Patient did not have much improvement with using tall tracks.  She did not have much improvement with using the Uloric.  The wound is still painful there is still clear drainage there is no cellulitis.  Imaging: No results found. No images are attached to the encounter.  Labs: Lab Results  Component Value Date   ESRSEDRATE 36 (H) 07/20/2018   LABURIC 7.2 (H) 07/20/2018     Lab Results  Component Value Date   ALBUMIN 4.2 08/14/2009   ALBUMIN 3.9 08/01/2008   LABURIC 7.2 (H) 07/20/2018    Body mass index is 33.59 kg/m.  Orders:  No orders of the defined types were placed in this encounter.  No orders of the defined types were placed in this encounter.    Procedures: No procedures performed  Clinical Data: No additional findings.  ROS:  All other systems negative, except as noted in the HPI. Review of Systems  Objective: Vital Signs: Ht 5'  (1.524 m)   Wt 172 lb (78 kg)   BMI 33.59 kg/m   Specialty Comments:  No specialty comments available.  PMFS History: Patient Active Problem List   Diagnosis Date Noted  . Mass of soft tissue of foot 07/20/2018  . OME (otitis media with effusion) 04/27/2011  . RENAL CYST, LEFT 08/21/2009  . OBESITY 08/16/2008  . CHEST WALL PAIN, HX OF 04/05/2008  . MICROSCOPIC HEMATURIA 05/08/2007   Past Medical History:  Diagnosis Date  . Cystic kidney disease    left  . Microscopic hematuria     Family History  Problem Relation Age of Onset  . Diabetes Other   . Coronary artery disease Other   . Kidney disease Other   . Hematuria Other     History reviewed. No pertinent surgical history. Social History   Occupational History  . Not on file  Tobacco Use  . Smoking status: Never Smoker  . Smokeless tobacco: Never Used  Substance and Sexual Activity  . Alcohol use: Not on file  . Drug use: Not on file  . Sexual activity: Not on file

## 2018-08-13 ENCOUNTER — Other Ambulatory Visit: Payer: BC Managed Care – PPO

## 2018-10-08 ENCOUNTER — Ambulatory Visit (INDEPENDENT_AMBULATORY_CARE_PROVIDER_SITE_OTHER): Payer: BC Managed Care – PPO | Admitting: Orthopedic Surgery

## 2018-10-08 ENCOUNTER — Ambulatory Visit: Payer: Self-pay

## 2018-10-08 ENCOUNTER — Encounter: Payer: Self-pay | Admitting: Orthopedic Surgery

## 2018-10-08 VITALS — Ht 60.0 in | Wt 172.0 lb

## 2018-10-08 DIAGNOSIS — M25872 Other specified joint disorders, left ankle and foot: Secondary | ICD-10-CM | POA: Diagnosis not present

## 2018-10-08 DIAGNOSIS — M79672 Pain in left foot: Secondary | ICD-10-CM

## 2018-10-12 ENCOUNTER — Encounter: Payer: Self-pay | Admitting: Orthopedic Surgery

## 2018-10-12 DIAGNOSIS — M25872 Other specified joint disorders, left ankle and foot: Secondary | ICD-10-CM

## 2018-10-12 MED ORDER — METHYLPREDNISOLONE ACETATE 40 MG/ML IJ SUSP
40.0000 mg | INTRAMUSCULAR | Status: AC | PRN
  Administered 2018-10-12: 40 mg via INTRA_ARTICULAR

## 2018-10-12 MED ORDER — LIDOCAINE HCL 1 % IJ SOLN
2.0000 mL | INTRAMUSCULAR | Status: AC | PRN
  Administered 2018-10-12: 2 mL

## 2018-10-12 NOTE — Progress Notes (Signed)
Office Visit Note   Patient: Carrie Caldwell           Date of Birth: 03/06/1965           MRN: [id number] Visit Date: 10/08/2018              Requested by: Eartha InchBadger, Michael C, MD [address] [place],  [place] [id number] PCP: Eartha InchBadger, Michael C, MD  Chief Complaint  Patient presents with  . Left Foot - Pain      HPI: Patient is a 53 year old woman who presents with increasing pain and swelling around the lateral aspect of the left foot and ankle.  She states it wakes her up at night denies any instability she states she is on her feet for about 16 hours a day and has pain with ambulation and decreased range of motion of her ankle.  Assessment & Plan: Visit Diagnoses:  1. Pain in left foot   2. Impingement of left ankle joint     Plan: The ankle was injected she tolerated this well discussed that the swelling over the lateral aspect of her ankle is most likely coming from the tibial talar joint and if the injection does not help would recommend an MRI scan to further evaluate the ankle.  Follow-Up Instructions: Return if symptoms worsen or fail to improve.   Ortho Exam  Patient is alert, oriented, no adenopathy, well-dressed, normal affect, normal respiratory effort. Examination patient has swelling around the lateral malleolus anterior and posteriorly she is tender to palpation over the joint line she does have a little bit of laxity with an anterior drawer about 2 mm of laxity.  She has an intact posterior tibial tendon peroneal tendons are intact she has increased valgus of the left hindfoot with weightbearing she ambulates in high heels.  Patient has had slightly elevated uric acid but gout medication has not changed her symptoms.  Imaging: No results found. No images are attached to the encounter.  Labs: Lab Results  Component Value Date   ESRSEDRATE 36 (H) 07/20/2018   LABURIC 7.2 (H) 07/20/2018     Lab Results  Component Value Date   ALBUMIN 4.2 08/14/2009   ALBUMIN 3.9 08/01/2008   LABURIC 7.2 (H) 07/20/2018    No results found for: MG No results found for: VD25OH  No results found for: PREALBUMIN CBC EXTENDED Latest Ref Rng & Units 08/14/2009 08/01/2008  WBC 4.5 - 10.5 10*3/microliter 7.0 9.3  RBC 3.87 - 5.11 M/uL 4.26 4.02  HGB 12.0 - 15.0 g/dL 19.112.7 47.812.0  HCT 29.536.0 - 62.146.0 % 36.5 34.7(L)  PLT 150.0 - 400.0 K/uL 285.0 292.0  NEUTROABS 1.4 - 7.7 K/uL 4.2 6.7  LYMPHSABS 0.7 - 4.0 K/uL 2.2 1.7     Body mass index is 33.59 kg/m.  Orders:  Orders Placed This Encounter  Procedures  . XR Foot Complete Left   No orders of the defined types were placed in this encounter.    Procedures: Medium Joint Inj: L ankle on 10/12/2018 11:44 AM Indications: pain and diagnostic evaluation Details: 22 G 1.5 in needle, anteromedial approach Medications: 2 mL lidocaine 1 %; 40 mg methylPREDNISolone acetate 40 MG/ML Outcome: tolerated well, no immediate complications Procedure, treatment alternatives, risks and benefits explained, specific risks discussed. Consent was given by the patient. Immediately prior to procedure a time out was called to verify the correct patient, procedure, equipment, support staff and site/side marked as required. Patient was prepped and draped in the usual sterile  fashion.      Clinical Data: No additional findings.  ROS:  All other systems negative, except as noted in the HPI. Review of Systems  Objective: Vital Signs: Ht 5' (1.524 m)   Wt 172 lb (78 kg)   BMI 33.59 kg/m   Specialty Comments:  No specialty comments available.  PMFS History: Patient Active Problem List   Diagnosis Date Noted  . Mass of soft tissue of foot 07/20/2018  . OME (otitis media with effusion) 04/27/2011  . RENAL CYST, LEFT 08/21/2009  . OBESITY 08/16/2008  . CHEST WALL PAIN, HX OF 04/05/2008  . MICROSCOPIC HEMATURIA 05/08/2007   Past Medical History:  Diagnosis Date  . Cystic kidney disease    left  . Microscopic hematuria      Family History  Problem Relation Age of Onset  . Diabetes Other   . Coronary artery disease Other   . Kidney disease Other   . Hematuria Other     History reviewed. No pertinent surgical history. Social History   Occupational History  . Not on file  Tobacco Use  . Smoking status: Never Smoker  . Smokeless tobacco: Never Used  Substance and Sexual Activity  . Alcohol use: Not on file  . Drug use: Not on file  . Sexual activity: Not on file

## 2018-10-20 ENCOUNTER — Other Ambulatory Visit: Payer: Self-pay | Admitting: Registered"

## 2018-10-20 DIAGNOSIS — Z20822 Contact with and (suspected) exposure to covid-19: Secondary | ICD-10-CM

## 2018-10-22 LAB — NOVEL CORONAVIRUS, NAA: SARS-CoV-2, NAA: NOT DETECTED

## 2018-10-28 ENCOUNTER — Ambulatory Visit: Payer: BC Managed Care – PPO | Admitting: Family

## 2018-11-02 ENCOUNTER — Encounter: Payer: Self-pay | Admitting: Orthopedic Surgery

## 2018-11-02 ENCOUNTER — Ambulatory Visit (INDEPENDENT_AMBULATORY_CARE_PROVIDER_SITE_OTHER): Payer: BC Managed Care – PPO | Admitting: Orthopedic Surgery

## 2018-11-02 VITALS — Ht 60.0 in | Wt 172.0 lb

## 2018-11-02 DIAGNOSIS — M7672 Peroneal tendinitis, left leg: Secondary | ICD-10-CM

## 2018-11-02 NOTE — Progress Notes (Signed)
Office Visit Note   Patient: Carrie Caldwell           Date of Birth: [birth year]           MRN: [id number] Visit Date: 11/02/2018              Requested by: Chesley Noon, MD [address] [place],  [place] [address] PCP: Chesley Noon, MD  Chief Complaint  Patient presents with  . Left Foot - Pain, Follow-up  . Left Ankle - Pain, Follow-up      HPI: Patient is a 53 year old woman who presents with peroneal tendinitis and swelling left ankle.  Patient states she has pain with start up pain improves with progressive ambulation and then has pain at night.  Pain primarily at this time over the peroneal tendons.  Assessment & Plan: Visit Diagnoses:  1. Peroneal tendinitis of left lower leg     Plan: We will obtain an MRI scan to further evaluate the integrity of the peroneal tendons.  Possible partial thickness tear peroneus brevis.  Follow-Up Instructions: Return if symptoms worsen or fail to improve.   Ortho Exam  Patient is alert, oriented, no adenopathy, well-dressed, normal affect, normal respiratory effort. Examination patient has a palpable pulse the sinus Tarsi is nontender to palpation the anterior aspect of the ankle is nontender to palpation the posterior tibial tendon is nontender to palpation.  Palpation over the peroneal tendons reproduces her pain she does have significant swelling over the peroneal tendons.  Resisted eversion reproduces her pain.  She has good ankle and subtalar motion.  Imaging: No results found. No images are attached to the encounter.  Labs: Lab Results  Component Value Date   ESRSEDRATE 36 (H) 07/20/2018   LABURIC 7.2 (H) 07/20/2018     Lab Results  Component Value Date   ALBUMIN 4.2 08/14/2009   ALBUMIN 3.9 08/01/2008   LABURIC 7.2 (H) 07/20/2018    No results found for: MG No results found for: VD25OH  No results found for: PREALBUMIN CBC EXTENDED Latest Ref Rng & Units 08/14/2009 08/01/2008  WBC 4.5 - 10.5  10*3/microliter 7.0 9.3  RBC 3.87 - 5.11 M/uL 4.26 4.02  HGB 12.0 - 15.0 g/dL 12.7 12.0  HCT 36.0 - 46.0 % 36.5 34.7(L)  PLT 150.0 - 400.0 K/uL 285.0 292.0  NEUTROABS 1.4 - 7.7 K/uL 4.2 6.7  LYMPHSABS 0.7 - 4.0 K/uL 2.2 1.7     Body mass index is 33.59 kg/m.  Orders:  No orders of the defined types were placed in this encounter.  No orders of the defined types were placed in this encounter.    Procedures: No procedures performed  Clinical Data: No additional findings.  ROS:  All other systems negative, except as noted in the HPI. Review of Systems  Objective: Vital Signs: Ht 5' (1.524 m)   Wt 172 lb (78 kg)   BMI 33.59 kg/m   Specialty Comments:  No specialty comments available.  PMFS History: Patient Active Problem List   Diagnosis Date Noted  . Mass of soft tissue of foot 07/20/2018  . OME (otitis media with effusion) 04/27/2011  . RENAL CYST, LEFT 08/21/2009  . OBESITY 08/16/2008  . CHEST WALL PAIN, HX OF 04/05/2008  . MICROSCOPIC HEMATURIA 05/08/2007   Past Medical History:  Diagnosis Date  . Cystic kidney disease    left  . Microscopic hematuria     Family History  Problem Relation Age of Onset  . Diabetes Other   .  Coronary artery disease Other   . Kidney disease Other   . Hematuria Other     History reviewed. No pertinent surgical history. Social History   Occupational History  . Not on file  Tobacco Use  . Smoking status: Never Smoker  . Smokeless tobacco: Never Used  Substance and Sexual Activity  . Alcohol use: Not on file  . Drug use: Not on file  . Sexual activity: Not on file

## 2018-11-10 ENCOUNTER — Other Ambulatory Visit: Payer: Self-pay

## 2018-11-10 ENCOUNTER — Ambulatory Visit
Admission: RE | Admit: 2018-11-10 | Discharge: 2018-11-10 | Disposition: A | Discharge status: Home / Self Care | Payer: BC Managed Care – PPO | Source: Ambulatory Visit | Attending: Orthopedic Surgery | Admitting: Orthopedic Surgery

## 2018-11-10 DIAGNOSIS — M7672 Peroneal tendinitis, left leg: Secondary | ICD-10-CM

## 2019-01-25 ENCOUNTER — Other Ambulatory Visit: Payer: Self-pay

## 2019-01-25 ENCOUNTER — Ambulatory Visit: Payer: BC Managed Care – PPO | Admitting: Orthopedic Surgery

## 2019-01-25 ENCOUNTER — Encounter: Payer: Self-pay | Admitting: Orthopedic Surgery

## 2019-01-25 DIAGNOSIS — M7672 Peroneal tendinitis, left leg: Secondary | ICD-10-CM

## 2019-01-25 DIAGNOSIS — M76822 Posterior tibial tendinitis, left leg: Secondary | ICD-10-CM

## 2019-01-25 DIAGNOSIS — M25872 Other specified joint disorders, left ankle and foot: Secondary | ICD-10-CM

## 2019-01-25 NOTE — Progress Notes (Signed)
Office Visit Note   Patient: Carrie Caldwell           Date of Birth: [birth year]           MRN: [id number] Visit Date: 01/25/2019              Requested by: Chesley Noon, MD [address] [place],  [place] [address] PCP: Chesley Noon, MD  Chief Complaint  Patient presents with  . 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  She has worn medical compression stockings she has tried elevation she has tried an ankle stabilizing orthosis she states the pain is progressively getting worse.  She states that she stands for over 15 hours a day.  Assessment & Plan: Visit Diagnoses:  1. Peroneal tendinitis of left lower leg     Plan: Discussed that she is developing increasing pronation and valgus to the left foot with progressive posterior tibial tendon insufficiency.  We will place her in a posterior tibial tendon brace with a fracture boot.  Will follow-up in 4 weeks.  Follow-Up Instructions: Return in about 4 weeks (around 02/22/2019).   Ortho Exam  Patient is alert, oriented, no adenopathy, well-dressed, normal affect, normal respiratory effort. Examination patient has a good pulse she has progressive pronation and valgus of the hindfoot on the left she is tender to palpation of the posterior tibial tendon she cannot do a single limb heel raise she cannot get her heel off the ground.  She is tender to palpation laterally from impingement with the valgus hindfoot.  Imaging: No results found. No images are attached to the encounter.  Labs: Lab Results  Component Value Date   ESRSEDRATE 36 (H) 07/20/2018   LABURIC 7.2 (H) 07/20/2018     Lab Results  Component Value Date   ALBUMIN 4.2 08/14/2009   ALBUMIN 3.9 08/01/2008   LABURIC 7.2 (H) 07/20/2018    No results found for: MG No results found for: VD25OH  No results found for: PREALBUMIN CBC EXTENDED Latest Ref  Rng & Units 08/14/2009 08/01/2008  WBC 4.5 - 10.5 10*3/microliter 7.0 9.3  RBC 3.87 - 5.11 M/uL 4.26 4.02  HGB 12.0 - 15.0 g/dL 12.7 12.0  HCT 36.0 - 46.0 % 36.5 34.7(L)  PLT 150.0 - 400.0 K/uL 285.0 292.0  NEUTROABS 1.4 - 7.7 K/uL 4.2 6.7  LYMPHSABS 0.7 - 4.0 K/uL 2.2 1.7     There is no height or weight on file to calculate BMI.  Orders:  No orders of the defined types were placed in this encounter.  No orders of the defined types were placed in this encounter.    Procedures: No procedures performed  Clinical Data: No additional findings.  ROS:  All other systems negative, except as noted in the HPI. Review of Systems  Objective: Vital Signs: There were no vitals taken for this visit.  Specialty Comments:  No specialty comments available.  PMFS History: Patient Active Problem List   Diagnosis Date Noted  . Mass of soft tissue of foot 07/20/2018  . OME (otitis media with effusion) 04/27/2011  . RENAL CYST, LEFT 08/21/2009  . OBESITY 08/16/2008  . CHEST WALL PAIN, HX OF 04/05/2008  . MICROSCOPIC HEMATURIA 05/08/2007   Past Medical History:  Diagnosis Date  . Cystic kidney disease    left  . Microscopic hematuria  Family History  Problem Relation Age of Onset  . Diabetes Other   . Coronary artery disease Other   . Kidney disease Other   . Hematuria Other     History reviewed. No pertinent surgical history. Social History   Occupational History  . Not on file  Tobacco Use  . Smoking status: Never Smoker  . Smokeless tobacco: Never Used  Substance and Sexual Activity  . Alcohol use: Not on file  . Drug use: Not on file  . Sexual activity: Not on file

## 2019-01-26 ENCOUNTER — Encounter: Payer: Self-pay | Admitting: Orthopedic Surgery

## 2019-02-18 ENCOUNTER — Ambulatory Visit: Payer: BC Managed Care – PPO | Admitting: Orthopedic Surgery

## 2019-02-18 ENCOUNTER — Other Ambulatory Visit: Payer: Self-pay

## 2019-02-18 ENCOUNTER — Encounter: Payer: Self-pay | Admitting: Orthopedic Surgery

## 2019-02-18 VITALS — Ht 60.0 in | Wt 172.0 lb

## 2019-02-18 DIAGNOSIS — M76822 Posterior tibial tendinitis, left leg: Secondary | ICD-10-CM | POA: Diagnosis not present

## 2019-02-18 NOTE — Progress Notes (Signed)
Office Visit Note   Patient: Carrie Caldwell           Date of Birth: February 24, 1965           MRN: [id number] Visit Date: 02/18/2019              Requested by: Eartha Inch, MD [address] [place],  [place] [address] PCP: Eartha Inch, MD  Chief Complaint  Patient presents with  . Left Ankle - Follow-up      HPI: Patient is a 54 year old woman who is seen in follow-up for posterior tibial tendon insufficiency she is currently wearing an ankle stabilizing orthosis posterior tibial tendon brace within a fracture boot.  She states she is feeling better.  Assessment & Plan: Visit Diagnoses:  1. Posterior tibial tendinitis, left leg     Plan: We will continue with conservative treatment follow-up in 3 weeks.  Discussed that she may require subtalar and talonavicular fusion she would be off her foot for 2 weeks and then in a kneeling scooter for 4 weeks.  Follow-Up Instructions: Return in about 3 weeks (around 03/11/2019).   Ortho Exam  Patient is alert, oriented, no adenopathy, well-dressed, normal affect, normal respiratory effort. Examination patient has pronation valgus and pes planus secondary to the posterior tibial tendon insufficiency.  She has good ankle and subtalar motion she is tender to palpation in the sinus Tarsi from lateral impingement she is minimally tender to palpation along the posterior tibial tendon.  She cannot do a single limb heel rise.  Imaging: No results found. No images are attached to the encounter.  Labs: Lab Results  Component Value Date   ESRSEDRATE 36 (H) 07/20/2018   LABURIC 7.2 (H) 07/20/2018     Lab Results  Component Value Date   ALBUMIN 4.2 08/14/2009   ALBUMIN 3.9 08/01/2008   LABURIC 7.2 (H) 07/20/2018    No results found for: MG No results found for: VD25OH  No results found for: PREALBUMIN CBC EXTENDED Latest Ref Rng & Units 08/14/2009 08/01/2008  WBC 4.5 - 10.5 10*3/microliter 7.0 9.3  RBC 3.87 - 5.11 M/uL 4.26  4.02  HGB 12.0 - 15.0 g/dL 53.6 14.4  HCT 31.5 - 40.0 % 36.5 34.7(L)  PLT 150.0 - 400.0 K/uL 285.0 292.0  NEUTROABS 1.4 - 7.7 K/uL 4.2 6.7  LYMPHSABS 0.7 - 4.0 K/uL 2.2 1.7     Body mass index is 33.59 kg/m.  Orders:  No orders of the defined types were placed in this encounter.  No orders of the defined types were placed in this encounter.    Procedures: No procedures performed  Clinical Data: No additional findings.  ROS:  All other systems negative, except as noted in the HPI. Review of Systems  Objective: Vital Signs: Ht 5' (1.524 m)   Wt 172 lb (78 kg)   BMI 33.59 kg/m   Specialty Comments:  No specialty comments available.  PMFS History: Patient Active Problem List   Diagnosis Date Noted  . Mass of soft tissue of foot 07/20/2018  . OME (otitis media with effusion) 04/27/2011  . RENAL CYST, LEFT 08/21/2009  . OBESITY 08/16/2008  . CHEST WALL PAIN, HX OF 04/05/2008  . MICROSCOPIC HEMATURIA 05/08/2007   Past Medical History:  Diagnosis Date  . Cystic kidney disease    left  . Microscopic hematuria     Family History  Problem Relation Age of Onset  . Diabetes Other   . Coronary artery disease Other   .  Kidney disease Other   . Hematuria Other     History reviewed. No pertinent surgical history. Social History   Occupational History  . Not on file  Tobacco Use  . Smoking status: Never Smoker  . Smokeless tobacco: Never Used  Substance and Sexual Activity  . Alcohol use: Not on file  . Drug use: Not on file  . Sexual activity: Not on file

## 2019-03-11 ENCOUNTER — Encounter: Payer: Self-pay | Admitting: Orthopedic Surgery

## 2019-03-11 ENCOUNTER — Other Ambulatory Visit: Payer: Self-pay

## 2019-03-11 ENCOUNTER — Ambulatory Visit: Payer: BC Managed Care – PPO | Admitting: Orthopedic Surgery

## 2019-03-11 VITALS — Ht 60.0 in | Wt 172.0 lb

## 2019-03-11 DIAGNOSIS — M76822 Posterior tibial tendinitis, left leg: Secondary | ICD-10-CM | POA: Diagnosis not present

## 2019-03-11 NOTE — Progress Notes (Signed)
Office Visit Note   Patient: Carrie Caldwell           Date of Birth: October 30, 1965           MRN: [id number] Visit Date: 03/11/2019              Requested by: Eartha Inch, MD [address] [place],  [place] [address] PCP: Eartha Inch, MD  Chief Complaint  Patient presents with  . Left Ankle - Follow-up    Posterior tibial tendon insufficiency       HPI: Patient is a 54 year old woman who presents in follow-up for posterior tibial tendon insufficiency on the left she has a pronated valgus forefoot with valgus hindfoot.  She has been doing fascial strengthening and wearing the posterior tibial tendon brace within a fracture boot.  She states the pain is decreased she is increased her strength and mobility.  Assessment & Plan: Visit Diagnoses:  1. Posterior tibial tendinitis, left leg     Plan: Patient will continue with fascial strengthening discontinue the boot but continue to use the posterior tibial tendon brace.  Follow-Up Instructions: Return if symptoms worsen or fail to improve.   Ortho Exam  Patient is alert, oriented, no adenopathy, well-dressed, normal affect, normal respiratory effort. Examination patient's hindfoot is in valgus forefoot is pronated.  Patient has less swelling laterally still has a little bit of swelling over the sinus Tarsi and has no tenderness to palpation over the sinus Tarsi at this time.  The posterior tibial tendon is nontender to palpation she can now do a single limb heel raise.  Imaging: No results found. No images are attached to the encounter.  Labs: Lab Results  Component Value Date   ESRSEDRATE 36 (H) 07/20/2018   LABURIC 7.2 (H) 07/20/2018     Lab Results  Component Value Date   ALBUMIN 4.2 08/14/2009   ALBUMIN 3.9 08/01/2008   LABURIC 7.2 (H) 07/20/2018    No results found for: MG No results found for: VD25OH  No results found for: PREALBUMIN CBC EXTENDED Latest Ref Rng & Units 08/14/2009 08/01/2008  WBC  4.5 - 10.5 10*3/microliter 7.0 9.3  RBC 3.87 - 5.11 M/uL 4.26 4.02  HGB 12.0 - 15.0 g/dL 32.3 55.7  HCT 32.2 - 02.5 % 36.5 34.7(L)  PLT 150.0 - 400.0 K/uL 285.0 292.0  NEUTROABS 1.4 - 7.7 K/uL 4.2 6.7  LYMPHSABS 0.7 - 4.0 K/uL 2.2 1.7     Body mass index is 33.59 kg/m.  Orders:  No orders of the defined types were placed in this encounter.  No orders of the defined types were placed in this encounter.    Procedures: No procedures performed  Clinical Data: No additional findings.  ROS:  All other systems negative, except as noted in the HPI. Review of Systems  Objective: Vital Signs: Ht 5' (1.524 m)   Wt 172 lb (78 kg)   BMI 33.59 kg/m   Specialty Comments:  No specialty comments available.  PMFS History: Patient Active Problem List   Diagnosis Date Noted  . Mass of soft tissue of foot 07/20/2018  . OME (otitis media with effusion) 04/27/2011  . RENAL CYST, LEFT 08/21/2009  . OBESITY 08/16/2008  . CHEST WALL PAIN, HX OF 04/05/2008  . MICROSCOPIC HEMATURIA 05/08/2007   Past Medical History:  Diagnosis Date  . Cystic kidney disease    left  . Microscopic hematuria     Family History  Problem Relation Age of Onset  .  Diabetes Other   . Coronary artery disease Other   . Kidney disease Other   . Hematuria Other     History reviewed. No pertinent surgical history. Social History   Occupational History  . Not on file  Tobacco Use  . Smoking status: Never Smoker  . Smokeless tobacco: Never Used  Substance and Sexual Activity  . Alcohol use: Not on file  . Drug use: Not on file  . Sexual activity: Not on file

## 2019-08-02 ENCOUNTER — Other Ambulatory Visit: Payer: Self-pay

## 2019-08-04 ENCOUNTER — Other Ambulatory Visit: Payer: Self-pay | Admitting: Physician Assistant

## 2019-08-05 ENCOUNTER — Other Ambulatory Visit: Payer: Self-pay

## 2019-08-05 ENCOUNTER — Encounter (HOSPITAL_COMMUNITY): Payer: Self-pay | Admitting: Orthopedic Surgery

## 2019-08-05 ENCOUNTER — Other Ambulatory Visit (HOSPITAL_COMMUNITY)
Admission: RE | Admit: 2019-08-05 | Discharge: 2019-08-05 | Disposition: A | Discharge status: Home / Self Care | Payer: BC Managed Care – PPO | Source: Ambulatory Visit | Attending: Orthopedic Surgery | Admitting: Orthopedic Surgery

## 2019-08-05 DIAGNOSIS — Z01812 Encounter for preprocedural laboratory examination: Secondary | ICD-10-CM | POA: Insufficient documentation

## 2019-08-05 DIAGNOSIS — Z20822 Contact with and (suspected) exposure to covid-19: Secondary | ICD-10-CM | POA: Diagnosis not present

## 2019-08-05 LAB — SARS CORONAVIRUS 2 (TAT 6-24 HRS): SARS Coronavirus 2: NEGATIVE

## 2019-08-05 NOTE — Progress Notes (Addendum)
Spoke with pt for pre-op call. Pt denies cardiac history, HTN or Diabetes.  Covid test done today, results pending. Pt instructed to quarantine until surgery tomorrow. She voiced understanding.  ERAS protocol ordered. Instructed pt not to eat food after midnight, but may have clear liquids until 4:30 AM. Pt voiced understanding.

## 2019-08-06 ENCOUNTER — Ambulatory Visit (HOSPITAL_COMMUNITY): Payer: BC Managed Care – PPO | Admitting: Anesthesiology

## 2019-08-06 ENCOUNTER — Ambulatory Visit (HOSPITAL_COMMUNITY)
Admission: RE | Admit: 2019-08-06 | Discharge: 2019-08-06 | Disposition: A | Discharge status: Home / Self Care | Payer: BC Managed Care – PPO | Attending: Orthopedic Surgery | Admitting: Orthopedic Surgery

## 2019-08-06 ENCOUNTER — Encounter (HOSPITAL_COMMUNITY): Payer: Self-pay | Admitting: Orthopedic Surgery

## 2019-08-06 ENCOUNTER — Encounter (HOSPITAL_COMMUNITY)
Admission: RE | Disposition: A | Discharge status: Home / Self Care | Payer: Self-pay | Source: Home / Self Care | Attending: Orthopedic Surgery

## 2019-08-06 ENCOUNTER — Other Ambulatory Visit: Payer: Self-pay

## 2019-08-06 DIAGNOSIS — Z87442 Personal history of urinary calculi: Secondary | ICD-10-CM | POA: Diagnosis not present

## 2019-08-06 DIAGNOSIS — M21072 Valgus deformity, not elsewhere classified, left ankle: Secondary | ICD-10-CM | POA: Diagnosis not present

## 2019-08-06 DIAGNOSIS — Z8249 Family history of ischemic heart disease and other diseases of the circulatory system: Secondary | ICD-10-CM | POA: Diagnosis not present

## 2019-08-06 DIAGNOSIS — Q619 Cystic kidney disease, unspecified: Secondary | ICD-10-CM | POA: Diagnosis not present

## 2019-08-06 DIAGNOSIS — Z841 Family history of disorders of kidney and ureter: Secondary | ICD-10-CM | POA: Diagnosis not present

## 2019-08-06 DIAGNOSIS — M76822 Posterior tibial tendinitis, left leg: Secondary | ICD-10-CM

## 2019-08-06 DIAGNOSIS — Z833 Family history of diabetes mellitus: Secondary | ICD-10-CM | POA: Insufficient documentation

## 2019-08-06 DIAGNOSIS — M76892 Other specified enthesopathies of left lower limb, excluding foot: Secondary | ICD-10-CM | POA: Insufficient documentation

## 2019-08-06 HISTORY — DX: Anemia, unspecified: D64.9

## 2019-08-06 HISTORY — DX: Personal history of urinary calculi: Z87.442

## 2019-08-06 HISTORY — PX: ANKLE FUSION: SHX5718

## 2019-08-06 LAB — CBC
HCT: 35.7 % — ABNORMAL LOW (ref 36.0–46.0)
Hemoglobin: 11.6 g/dL — ABNORMAL LOW (ref 12.0–15.0)
MCH: 28.1 pg (ref 26.0–34.0)
MCHC: 32.5 g/dL (ref 30.0–36.0)
MCV: 86.4 fL (ref 80.0–100.0)
Platelets: 318 10*3/uL (ref 150–400)
RBC: 4.13 MIL/uL (ref 3.87–5.11)
RDW: 13 % (ref 11.5–15.5)
WBC: 8.2 10*3/uL (ref 4.0–10.5)
nRBC: 0 % (ref 0.0–0.2)

## 2019-08-06 LAB — BASIC METABOLIC PANEL
Anion gap: 10 (ref 5–15)
BUN: 15 mg/dL (ref 6–20)
CO2: 23 mmol/L (ref 22–32)
Calcium: 9.1 mg/dL (ref 8.9–10.3)
Chloride: 106 mmol/L (ref 98–111)
Creatinine, Ser: 0.64 mg/dL (ref 0.44–1.00)
GFR calc Af Amer: >60 mL/min (ref >60)
GFR calc non Af Amer: >60 mL/min (ref >60)
Glucose, Bld: 98 mg/dL (ref 70–99)
Potassium: 3.7 mmol/L (ref 3.5–5.1)
Sodium: 139 mmol/L (ref 135–145)

## 2019-08-06 SURGERY — ANKLE FUSION
Anesthesia: General | Site: Ankle | Laterality: Left

## 2019-08-06 MED ORDER — MIDAZOLAM HCL 2 MG/2ML IJ SOLN
INTRAMUSCULAR | Status: AC
  Filled 2019-08-06: qty 2

## 2019-08-06 MED ORDER — PHENYLEPHRINE 40 MCG/ML (10ML) SYRINGE FOR IV PUSH (FOR BLOOD PRESSURE SUPPORT)
PREFILLED_SYRINGE | INTRAVENOUS | Status: DC | PRN
  Administered 2019-08-06: 80 ug via INTRAVENOUS
  Administered 2019-08-06: 40 ug via INTRAVENOUS
  Administered 2019-08-06: 120 ug via INTRAVENOUS
  Administered 2019-08-06: 160 ug via INTRAVENOUS
  Administered 2019-08-06 (×2): 80 ug via INTRAVENOUS

## 2019-08-06 MED ORDER — EPHEDRINE SULFATE-NACL 50-0.9 MG/10ML-% IV SOSY
PREFILLED_SYRINGE | INTRAVENOUS | Status: DC | PRN
  Administered 2019-08-06: 20 mg via INTRAVENOUS
  Administered 2019-08-06 (×2): 10 mg via INTRAVENOUS

## 2019-08-06 MED ORDER — DEXAMETHASONE SODIUM PHOSPHATE 10 MG/ML IJ SOLN
INTRAMUSCULAR | Status: DC | PRN
Start: 2019-08-06 — End: 2019-08-06
  Administered 2019-08-06: 10 mg via INTRAVENOUS

## 2019-08-06 MED ORDER — 0.9 % SODIUM CHLORIDE (POUR BTL) OPTIME
TOPICAL | Status: DC | PRN
  Administered 2019-08-06: 1000 mL

## 2019-08-06 MED ORDER — LACTATED RINGERS IV SOLN: INTRAVENOUS | Status: DC

## 2019-08-06 MED ORDER — LIDOCAINE 2% (20 MG/ML) 5 ML SYRINGE
INTRAMUSCULAR | Status: DC | PRN
  Administered 2019-08-06: 40 mg via INTRAVENOUS

## 2019-08-06 MED ORDER — ONDANSETRON HCL 4 MG/2ML IJ SOLN
INTRAMUSCULAR | Status: DC | PRN
  Administered 2019-08-06: 4 mg via INTRAVENOUS

## 2019-08-06 MED ORDER — ACETAMINOPHEN 160 MG/5ML PO SOLN: 325.0000 mg | Freq: Once | ORAL | Status: DC | PRN

## 2019-08-06 MED ORDER — ACETAMINOPHEN 325 MG PO TABS: 325.0000 mg | ORAL_TABLET | Freq: Once | ORAL | Status: DC | PRN

## 2019-08-06 MED ORDER — LIDOCAINE 2% (20 MG/ML) 5 ML SYRINGE
INTRAMUSCULAR | Status: AC
  Filled 2019-08-06: qty 5

## 2019-08-06 MED ORDER — BUPIVACAINE-EPINEPHRINE (PF) 0.5% -1:200000 IJ SOLN
INTRAMUSCULAR | Status: DC | PRN
Start: 2019-08-06 — End: 2019-08-06
  Administered 2019-08-06: 15 mL via PERINEURAL
  Administered 2019-08-06: 25 mL via PERINEURAL

## 2019-08-06 MED ORDER — EPHEDRINE 5 MG/ML INJ
INTRAVENOUS | Status: AC
  Filled 2019-08-06: qty 10

## 2019-08-06 MED ORDER — CHLORHEXIDINE GLUCONATE 0.12 % MT SOLN
15.0000 mL | Freq: Once | OROMUCOSAL | Status: AC
  Administered 2019-08-06: 15 mL via OROMUCOSAL
  Filled 2019-08-06: qty 15

## 2019-08-06 MED ORDER — MIDAZOLAM HCL 5 MG/5ML IJ SOLN
INTRAMUSCULAR | Status: DC | PRN
  Administered 2019-08-06: 2 mg via INTRAVENOUS

## 2019-08-06 MED ORDER — FENTANYL CITRATE (PF) 250 MCG/5ML IJ SOLN
INTRAMUSCULAR | Status: AC
  Filled 2019-08-06: qty 5

## 2019-08-06 MED ORDER — PROPOFOL 10 MG/ML IV BOLUS
INTRAVENOUS | Status: AC
  Filled 2019-08-06: qty 20

## 2019-08-06 MED ORDER — MEPERIDINE HCL 25 MG/ML IJ SOLN: 6.2500 mg | INTRAMUSCULAR | Status: DC | PRN

## 2019-08-06 MED ORDER — OXYCODONE-ACETAMINOPHEN 5-325 MG PO TABS
1.0000 | ORAL_TABLET | ORAL | 0 refills | Status: AC | PRN
Start: 2019-08-06 — End: 2020-08-05

## 2019-08-06 MED ORDER — CEFAZOLIN SODIUM-DEXTROSE 2-4 GM/100ML-% IV SOLN
2.0000 g | INTRAVENOUS | Status: AC
  Administered 2019-08-06: 2 g via INTRAVENOUS
  Filled 2019-08-06: qty 100

## 2019-08-06 MED ORDER — SUCCINYLCHOLINE CHLORIDE 200 MG/10ML IV SOSY
PREFILLED_SYRINGE | INTRAVENOUS | Status: AC
  Filled 2019-08-06: qty 10

## 2019-08-06 MED ORDER — HYDROMORPHONE HCL 1 MG/ML IJ SOLN: 0.2500 mg | INTRAMUSCULAR | Status: DC | PRN

## 2019-08-06 MED ORDER — ACETAMINOPHEN 10 MG/ML IV SOLN: 1000.0000 mg | Freq: Once | INTRAVENOUS | Status: DC | PRN

## 2019-08-06 MED ORDER — ORAL CARE MOUTH RINSE: 15.0000 mL | Freq: Once | OROMUCOSAL | Status: AC

## 2019-08-06 MED ORDER — PROPOFOL 10 MG/ML IV BOLUS
INTRAVENOUS | Status: DC | PRN
  Administered 2019-08-06: 150 mg via INTRAVENOUS

## 2019-08-06 MED ORDER — FENTANYL CITRATE (PF) 100 MCG/2ML IJ SOLN
INTRAMUSCULAR | Status: DC | PRN
  Administered 2019-08-06: 100 ug via INTRAVENOUS
  Administered 2019-08-06: 50 ug via INTRAVENOUS

## 2019-08-06 MED ORDER — PROMETHAZINE HCL 25 MG/ML IJ SOLN: 6.2500 mg | INTRAMUSCULAR | Status: DC | PRN

## 2019-08-06 SURGICAL SUPPLY — 52 items
BANDAGE ESMARK 6X9 LF (GAUZE/BANDAGES/DRESSINGS) ×1 IMPLANT
BIT DRILL 3.2 CANN STRGHT (BIT) ×3 IMPLANT
BIT DRILL PROFILE 7.0 FT (DRILL) ×1 IMPLANT
BLADE AVERAGE 25MMX9MM (BLADE) ×1
BLADE AVERAGE 25X9 (BLADE) ×2 IMPLANT
BLADE SAW SGTL MED 73X18.5 STR (BLADE) ×3 IMPLANT
BLADE SURG 10 STRL SS (BLADE) IMPLANT
BNDG COHESIVE 4X5 TAN STRL (GAUZE/BANDAGES/DRESSINGS) ×3 IMPLANT
BNDG ESMARK 6X9 LF (GAUZE/BANDAGES/DRESSINGS) ×3
BNDG GAUZE ELAST 4 BULKY (GAUZE/BANDAGES/DRESSINGS) ×6 IMPLANT
COVER MAYO STAND STRL (DRAPES) IMPLANT
COVER SURGICAL LIGHT HANDLE (MISCELLANEOUS) ×6 IMPLANT
COVER WAND RF STERILE (DRAPES) ×3 IMPLANT
DRAPE OEC MINIVIEW 54X84 (DRAPES) IMPLANT
DRAPE U-SHAPE 47X51 STRL (DRAPES) ×3 IMPLANT
DRILL PROFILE 7.0 FT (DRILL) ×3
DRSG ADAPTIC 3X8 NADH LF (GAUZE/BANDAGES/DRESSINGS) ×3 IMPLANT
DRSG EMULSION OIL 3X3 NADH (GAUZE/BANDAGES/DRESSINGS) ×3 IMPLANT
DURAPREP 26ML APPLICATOR (WOUND CARE) ×3 IMPLANT
ELECT REM PT RETURN 9FT ADLT (ELECTROSURGICAL) ×3
ELECTRODE REM PT RTRN 9FT ADLT (ELECTROSURGICAL) ×1 IMPLANT
GAUZE SPONGE 4X4 12PLY STRL (GAUZE/BANDAGES/DRESSINGS) ×3 IMPLANT
GLOVE BIOGEL PI IND STRL 9 (GLOVE) ×1 IMPLANT
GLOVE BIOGEL PI INDICATOR 9 (GLOVE) ×2
GLOVE SURG ORTHO 9.0 STRL STRW (GLOVE) ×3 IMPLANT
GOWN STRL REUS W/ TWL LRG LVL3 (GOWN DISPOSABLE) ×1 IMPLANT
GOWN STRL REUS W/ TWL XL LVL3 (GOWN DISPOSABLE) ×1 IMPLANT
GOWN STRL REUS W/TWL LRG LVL3 (GOWN DISPOSABLE) ×3
GOWN STRL REUS W/TWL XL LVL3 (GOWN DISPOSABLE) ×3
GUIDEWIRE NIT TT 2.4X235 (WIRE) ×6 IMPLANT
K-WIRE TROCAR TI .045 (WIRE) ×3
KIT BASIN OR (CUSTOM PROCEDURE TRAY) ×3 IMPLANT
KIT TURNOVER KIT B (KITS) ×3 IMPLANT
KWIRE TROCAR TI .045 (WIRE) ×1 IMPLANT
NEEDLE SPNL 18GX3.5 QUINCKE PK (NEEDLE) ×3 IMPLANT
NS IRRIG 1000ML POUR BTL (IV SOLUTION) ×3 IMPLANT
PACK ORTHO EXTREMITY (CUSTOM PROCEDURE TRAY) ×3 IMPLANT
PAD ARMBOARD 7.5X6 YLW CONV (MISCELLANEOUS) ×6 IMPLANT
PUTTY DBM ALLOSYNC PURE 5CC (Putty) ×3 IMPLANT
SCREW CANN FT STD 4X40 (Screw) ×3 IMPLANT
SCREW COMPR FT 7X70 (Screw) ×3 IMPLANT
SCREW COMPR FT 7X75 (Screw) ×3 IMPLANT
SPONGE LAP 18X18 RF (DISPOSABLE) ×3 IMPLANT
STAPLE SUPERMX NITI 20X20 (Staple) ×6 IMPLANT
SUCTION FRAZIER HANDLE 10FR (MISCELLANEOUS) ×3
SUCTION TUBE FRAZIER 10FR DISP (MISCELLANEOUS) ×1 IMPLANT
SUT ETHILON 2 0 PSLX (SUTURE) ×9 IMPLANT
TOWEL GREEN STERILE (TOWEL DISPOSABLE) ×3 IMPLANT
TOWEL GREEN STERILE FF (TOWEL DISPOSABLE) ×3 IMPLANT
TUBE CONNECTING 12'X1/4 (SUCTIONS) ×1
TUBE CONNECTING 12X1/4 (SUCTIONS) ×2 IMPLANT
WATER STERILE IRR 1000ML POUR (IV SOLUTION) ×3 IMPLANT

## 2019-08-06 NOTE — Anesthesia Procedure Notes (Signed)
Anesthesia Regional Block: Popliteal block   Pre-Anesthetic Checklist: ,, timeout performed, Correct Patient, Correct Site, Correct Laterality, Correct Procedure, Correct Position, site marked, Risks and benefits discussed,  Surgical consent,  Pre-op evaluation,  At surgeon's request and post-op pain management  Laterality: Left  Prep: chloraprep       Needles:  Injection technique: Single-shot  Needle Type: Echogenic Stimulator Needle     Needle Length: 9cm  Needle Gauge: 21     Additional Needles:   Procedures:,,,, ultrasound used (permanent image in chart),,,,  Narrative:  Start time: 08/06/2019 7:10 AM End time: 08/06/2019 7:14 AM Injection made incrementally with aspirations every 5 mL.  Performed by: Personally  Anesthesiologist: Shelton Silvas, MD  Additional Notes: Patient tolerated the procedure well. Local anesthetic introduced in an incremental fashion under minimal resistance after negative aspirations. No paresthesias were elicited. After completion of the procedure, no acute issues were identified and patient continued to be monitored by RN.

## 2019-08-06 NOTE — Progress Notes (Signed)
Orthopedic Tech Progress Note Patient Details:  Carrie Caldwell [birth year] [id number]  Ortho Devices Type of Ortho Device: CAM walker Ortho Device/Splint Location: LLE Ortho Device/Splint Interventions: Ordered, Application   Post Interventions Patient Tolerated: Well Instructions Provided: Care of device   Donald Pore 08/06/2019, 10:35 AM

## 2019-08-06 NOTE — Anesthesia Postprocedure Evaluation (Signed)
Anesthesia Post Note  Patient: Tezra Mahr Cockerham  Procedure(s) Performed: LEFT TALONAVICULAR AND SUBTALAR FUSION (Left Ankle)     Patient location during evaluation: PACU Anesthesia Type: General Level of consciousness: awake and alert Pain management: pain level controlled Vital Signs Assessment: post-procedure vital signs reviewed and stable Respiratory status: spontaneous breathing, nonlabored ventilation, respiratory function stable and patient connected to nasal cannula oxygen Cardiovascular status: blood pressure returned to baseline and stable Postop Assessment: no apparent nausea or vomiting Anesthetic complications: no   No complications documented.  Last Vitals:  Vitals:   08/06/19 0934 08/06/19 0950  BP: 138/78 118/68  Pulse: 81 80  Resp: 16 16  Temp: 36.7 C   SpO2: 99% 100%    Last Pain:  Vitals:   08/06/19 0934  TempSrc:   PainSc: 0-No pain                 Shelton Silvas

## 2019-08-06 NOTE — Anesthesia Procedure Notes (Signed)
Anesthesia Regional Block: Adductor canal block   Pre-Anesthetic Checklist: ,, timeout performed, Correct Patient, Correct Site, Correct Laterality, Correct Procedure, Correct Position, site marked, Risks and benefits discussed,  Surgical consent,  Pre-op evaluation,  At surgeon's request and post-op pain management  Laterality: Left  Prep: chloraprep       Needles:  Injection technique: Single-shot  Needle Type: Echogenic Stimulator Needle     Needle Length: 9cm  Needle Gauge: 21     Additional Needles:   Procedures:,,,, ultrasound used (permanent image in chart),,,,  Narrative:  Start time: 08/06/2019 7:14 AM End time: 08/06/2019 7:18 AM Injection made incrementally with aspirations every 5 mL.  Performed by: Personally  Anesthesiologist: Shelton Silvas, MD  Additional Notes: Patient tolerated the procedure well. Local anesthetic introduced in an incremental fashion under minimal resistance after negative aspirations. No paresthesias were elicited. After completion of the procedure, no acute issues were identified and patient continued to be monitored by RN.

## 2019-08-06 NOTE — H&P (Signed)
Carrie Caldwell is an 54 y.o. female.   Chief Complaint: Left posterior tibial tendon dysfunction HPI: Patient is a 54 year old woman who presents in follow-up for posterior tibial tendon insufficiency on the left she has a pronated valgus forefoot with valgus hindfoot.  She has been doing fascial strengthening and wearing the posterior tibial tendon brace within a fracture boot.  She states the pain is decreased she is increased her strength and mobility.  Past Medical History:  Diagnosis Date  . Anemia    low iron in the past  . Cystic kidney disease    left  . History of kidney stones   . Microscopic hematuria     Past Surgical History:  Procedure Laterality Date  . COLONOSCOPY      Family History  Problem Relation Age of Onset  . Diabetes Other   . Coronary artery disease Other   . Kidney disease Other   . Hematuria Other    Social History:  reports that she has never smoked. She has never used smokeless tobacco. She reports previous alcohol use. She reports that she does not use drugs.  Allergies: No Known Allergies  No medications prior to admission.    Results for orders placed or performed during the hospital encounter of 08/05/19 (from the past 48 hour(s))  SARS CORONAVIRUS 2 (TAT 6-24 HRS) Nasopharyngeal Nasopharyngeal Swab     Status: None   Collection Time: 08/05/19  9:29 AM   Specimen: Nasopharyngeal Swab  Result Value Ref Range   SARS Coronavirus 2 NEGATIVE NEGATIVE    Comment: (NOTE) SARS-CoV-2 target nucleic acids are NOT DETECTED.  The SARS-CoV-2 RNA is generally detectable in upper and lower respiratory specimens during the acute phase of infection. Negative results do not preclude SARS-CoV-2 infection, do not rule out co-infections with other pathogens, and should not be used as the sole basis for treatment or other patient management decisions. Negative results must be combined with clinical observations, patient history, and epidemiological  information. The expected result is Negative.  Fact Sheet for Patients: HairSlick.no  Fact Sheet for Healthcare Providers: quierodirigir.com  This test is not yet approved or cleared by the [place] FDA and  has been authorized for detection and/or diagnosis of SARS-CoV-2 by FDA under an Emergency Use Authorization (EUA). This EUA will remain  in effect (meaning this test can be used) for the duration of the COVID-19 declaration under Se ction 564(b)(1) of the Act, 21 U.S.C. section 360bbb-3(b)(1), unless the authorization is terminated or revoked sooner.  Performed at [facility], [address], [place], [place] [address]    No results found.  Review of Systems  All other systems reviewed and are negative.   Blood pressure 124/71, pulse 68, temperature 98.3 F (36.8 C), temperature source Oral, resp. rate 20, height 5' (1.524 m), weight 95.3 kg, last menstrual period 03/08/2011, SpO2 99 %. Physical Exam  Patient is alert, oriented, no adenopathy, well-dressed, normal affect, normal respiratory effort. Examination patient's hindfoot is in valgus forefoot is pronated.  Patient has less swelling laterally still has a little bit of swelling over the sinus Tarsi and has no tenderness to palpation over the sinus Tarsi at this time.  The posterior tibial tendon is nontender to palpation she can now do a single limb heel raise.Heart RRR Lungs Clear Assessment/Plan  1. Posterior tibial tendinitis, left leg     Plan: Patient will continue with fascial strengthening discontinue the boot but continue to use the posterior tibial  tendon brace.If this is not successful we discussed surgery  Which would be a talonavicular and subtalar fusion. Risks and outcomes were discussed  [place] Alphonzo Devera, PA 08/06/2019, 6:03 AM

## 2019-08-06 NOTE — Anesthesia Preprocedure Evaluation (Addendum)
Anesthesia Evaluation  Patient identified by MRN, date of birth, ID band Patient awake    Reviewed: Allergy & Precautions, NPO status , Patient's Chart, lab work & pertinent test results  Airway Mallampati: II  TM Distance: >3 FB Neck ROM: Full    Dental  (+) Teeth Intact, Dental Advisory Given   Pulmonary neg pulmonary ROS,    breath sounds clear to auscultation       Cardiovascular negative cardio ROS   Rhythm:Regular Rate:Normal     Neuro/Psych negative neurological ROS     GI/Hepatic negative GI ROS, Neg liver ROS,   Endo/Other  negative endocrine ROS  Renal/GU Renal InsufficiencyRenal disease     Musculoskeletal negative musculoskeletal ROS (+)   Abdominal Normal abdominal exam  (+)   Peds  Hematology negative hematology ROS (+)   Anesthesia Other Findings   Reproductive/Obstetrics                            Anesthesia Physical Anesthesia Plan  ASA: III  Anesthesia Plan: General   Post-op Pain Management: GA combined w/ Regional for post-op pain   Induction: Intravenous  PONV Risk Score and Plan: 3 and Ondansetron and Dexamethasone  Airway Management Planned: LMA  Additional Equipment: None  Intra-op Plan:   Post-operative Plan: Extubation in OR  Informed Consent: I have reviewed the patients History and Physical, chart, labs and discussed the procedure including the risks, benefits and alternatives for the proposed anesthesia with the patient or authorized representative who has indicated his/her understanding and acceptance.       Plan Discussed with: CRNA  Anesthesia Plan Comments:        Anesthesia Quick Evaluation

## 2019-08-06 NOTE — Op Note (Signed)
08/06/2019  9:09 AM  PATIENT:  Carrie Caldwell    PRE-OPERATIVE DIAGNOSIS:  Left Posterior Tibial Tendon Insufficiency  POST-OPERATIVE DIAGNOSIS:  Same  PROCEDURE:  LEFT TALONAVICULAR AND SUBTALAR FUSION  SURGEON:  Nadara Mustard, MD  PHYSICIAN ASSISTANT:None ANESTHESIA:   General  PREOPERATIVE INDICATIONS:  Carrie Caldwell is a  54 y.o. female with a diagnosis of Left Posterior Tibial Tendon Insufficiency who failed conservative measures and elected for surgical management.    The risks benefits and alternatives were discussed with the patient preoperatively including but not limited to the risks of infection, bleeding, nerve injury, cardiopulmonary complications, the need for revision surgery, among others, and the patient was willing to proceed.  OPERATIVE IMPLANTS: Arthrex staples x2 4 mm cannulated screw x1 7 mm cannulated screws x2 with demineralized bone matrix 5 cc  @ENCIMAGES @  OPERATIVE FINDINGS: C-arm fluoroscopy verified alignment of the internal fixation.  OPERATIVE PROCEDURE: Patient brought the operating room after undergoing a regional block she then underwent a general anesthetic.  After adequate levels anesthesia were obtained patient's left lower extremity was prepped using DuraPrep draped into a sterile field Ioban was used to cover all exposed skin.  An oblique incision was made over the sinus Tarsi.  This was carried down to the subtalar joint retractors were placed to protect the peroneal tendons.  Using a bur the articular surface was debrided from the posterior facet.  This was further debrided with a curette.  There was good petechial bleeding in the subtalar joint.  After debridement and irrigation the joint was packed with bone graft.  Attention was then focused on the talonavicular joint.  A dorsal incision was made between the anterior tibial tendon and the EHL this was carried down to the talonavicular joint a retractor was placed to protect the neurovascular  bundle and the bur was used to debride the articular surface from the talonavicular joint this was completed with a curette the wound was irrigated with normal saline and the joint was packed with bone graft.  2 Arthrex 20 x 20 mm screws were placed dorsally sound fluoroscopy verified alignment and a separate perpendicular 4.0 cannulated screw was used to reinforce the repair and C-arm fluoroscopy verified alignment.  Attention was then focused on fusion of the subtalar joint.  2 guidewires were placed across the subtalar joint C-arm fluoroscopy verified alignment and to 7.0 cannulated screws 70 and 75 mm were placed across the subtalar joint.  C-arm fluoroscopy verified alignment in both AP and lateral planes.  The incisions were closed using 2-0 nylon a sterile dressing was applied patient was extubated taken the PACU in stable condition.   DISCHARGE PLANNING:  Antibiotic duration: Preoperative antibiotics  Weightbearing: Nonweightbearing on the left  Pain medication: Prescription for Percocet  Dressing care/ Wound VAC: Dry dressing change at follow-up  Ambulatory devices: Walker crutches or kneeling scooter  Discharge to: Home.  Follow-up: In the office 1 week post operative.

## 2019-08-06 NOTE — Transfer of Care (Signed)
Immediate Anesthesia Transfer of Care Note  Patient: Carrie Caldwell  Procedure(s) Performed: LEFT TALONAVICULAR AND SUBTALAR FUSION (Left Ankle)  Patient Location: PACU  Anesthesia Type:GA combined with regional for post-op pain  Level of Consciousness: drowsy and patient cooperative  Airway & Oxygen Therapy: Patient Spontanous Breathing  Post-op Assessment: Report given to RN and Post -op Vital signs reviewed and stable  Post vital signs: Reviewed and stable  Last Vitals:  Vitals Value Taken Time  BP 113/70 08/06/19 0904  Temp    Pulse 78 08/06/19 0904  Resp 22 08/06/19 0904  SpO2 98 % 08/06/19 0904  Vitals shown include unvalidated device data.  Last Pain:  Vitals:   08/06/19 0620  TempSrc:   PainSc: 0-No pain         Complications: No complications documented.

## 2019-08-06 NOTE — Anesthesia Procedure Notes (Signed)
Procedure Name: LMA Insertion Date/Time: 08/06/2019 7:40 AM Performed by: Rosiland Oz, CRNA Pre-anesthesia Checklist: Patient identified, Emergency Drugs available, Suction available, Patient being monitored and Timeout performed Patient Re-evaluated:Patient Re-evaluated prior to induction Oxygen Delivery Method: Circle system utilized Preoxygenation: Pre-oxygenation with 100% oxygen Induction Type: IV induction LMA: LMA inserted LMA Size: 3.0 Number of attempts: 1 Placement Confirmation: positive ETCO2 and breath sounds checked- equal and bilateral Tube secured with: Tape

## 2019-08-10 ENCOUNTER — Encounter (HOSPITAL_COMMUNITY): Payer: Self-pay | Admitting: Orthopedic Surgery

## 2019-08-13 ENCOUNTER — Encounter: Payer: Self-pay | Admitting: Family

## 2019-08-13 ENCOUNTER — Other Ambulatory Visit: Payer: Self-pay

## 2019-08-13 ENCOUNTER — Ambulatory Visit (INDEPENDENT_AMBULATORY_CARE_PROVIDER_SITE_OTHER): Payer: BC Managed Care – PPO | Admitting: Family

## 2019-08-13 VITALS — Ht 60.0 in | Wt 210.0 lb

## 2019-08-13 DIAGNOSIS — Z981 Arthrodesis status: Secondary | ICD-10-CM

## 2019-08-13 NOTE — Progress Notes (Signed)
   Post-Op Visit Note   Patient: Carrie Caldwell           Date of Birth: 1965-10-19           MRN: [id number] Visit Date: 08/13/2019 PCP: Eartha Inch, MD  Chief Complaint:  Chief Complaint  Patient presents with  . Left Ankle - Routine Post Op    08/06/19 Left ankle talonavicular & subtalar fusion    HPI:  HPI Presents today status post left talonavicular and subtalar fusion on July 2. She is using a kneeling scooter as well as a cam walker. Has returned to work Ortho Exam Incisions are well approximated sutures healing well there is no bleeding she continues with moderate swelling no erythema no sign of infection  Visit Diagnoses:  1. H/O ankle fusion     Plan: She will follow up in 1 more week with radiographs of the left ankle. Consider harvesting sutures at that time continue nonweightbearing begin daily dose of cleansing and dry dressing changes  Follow-Up Instructions: Return in about 1 week (around 08/20/2019).   Imaging: No results found.  Orders:  No orders of the defined types were placed in this encounter.  No orders of the defined types were placed in this encounter.    PMFS History: Patient Active Problem List   Diagnosis Date Noted  . H/O ankle fusion 08/13/2019  . Posterior tibial tendon dysfunction, left   . Mass of soft tissue of foot 07/20/2018  . OME (otitis media with effusion) 04/27/2011  . RENAL CYST, LEFT 08/21/2009  . OBESITY 08/16/2008  . CHEST WALL PAIN, HX OF 04/05/2008  . MICROSCOPIC HEMATURIA 05/08/2007   Past Medical History:  Diagnosis Date  . Anemia    low iron in the past  . Cystic kidney disease    left  . History of kidney stones   . Microscopic hematuria     Family History  Problem Relation Age of Onset  . Diabetes Other   . Coronary artery disease Other   . Kidney disease Other   . Hematuria Other     Past Surgical History:  Procedure Laterality Date  . ANKLE FUSION Left 08/06/2019   Procedure: LEFT  TALONAVICULAR AND SUBTALAR FUSION;  Surgeon: Nadara Mustard, MD;  Location: [facility] OR;  Service: Orthopedics;  Laterality: Left;  . COLONOSCOPY     Social History   Occupational History  . Not on file  Tobacco Use  . Smoking status: Never Smoker  . Smokeless tobacco: Never Used  Vaping Use  . Vaping Use: Never used  Substance and Sexual Activity  . Alcohol use: Not Currently  . Drug use: Never  . Sexual activity: Not on file

## 2019-08-19 ENCOUNTER — Other Ambulatory Visit: Payer: Self-pay

## 2019-08-19 ENCOUNTER — Ambulatory Visit (INDEPENDENT_AMBULATORY_CARE_PROVIDER_SITE_OTHER): Payer: BC Managed Care – PPO | Admitting: Orthopedic Surgery

## 2019-08-19 ENCOUNTER — Ambulatory Visit (INDEPENDENT_AMBULATORY_CARE_PROVIDER_SITE_OTHER): Payer: BC Managed Care – PPO

## 2019-08-19 ENCOUNTER — Encounter: Payer: Self-pay | Admitting: Orthopedic Surgery

## 2019-08-19 VITALS — Ht 60.0 in | Wt 210.0 lb

## 2019-08-19 DIAGNOSIS — Z981 Arthrodesis status: Secondary | ICD-10-CM | POA: Diagnosis not present

## 2019-08-19 MED ORDER — TRAMADOL HCL 50 MG PO TABS: 100.0000 mg | ORAL_TABLET | Freq: Four times a day (QID) | ORAL | 0 refills | Status: DC | PRN

## 2019-08-26 ENCOUNTER — Ambulatory Visit (INDEPENDENT_AMBULATORY_CARE_PROVIDER_SITE_OTHER): Payer: BC Managed Care – PPO | Admitting: Physician Assistant

## 2019-08-26 ENCOUNTER — Encounter: Payer: Self-pay | Admitting: Orthopedic Surgery

## 2019-08-26 ENCOUNTER — Ambulatory Visit (INDEPENDENT_AMBULATORY_CARE_PROVIDER_SITE_OTHER): Payer: BC Managed Care – PPO

## 2019-08-26 ENCOUNTER — Other Ambulatory Visit: Payer: Self-pay

## 2019-08-26 VITALS — Ht 60.0 in | Wt 210.0 lb

## 2019-08-26 DIAGNOSIS — Z981 Arthrodesis status: Secondary | ICD-10-CM | POA: Diagnosis not present

## 2019-08-26 MED ORDER — TRAMADOL HCL 50 MG PO TABS: 100.0000 mg | ORAL_TABLET | Freq: Four times a day (QID) | ORAL | 0 refills | Status: DC | PRN

## 2019-08-26 MED ORDER — DOXYCYCLINE HYCLATE 100 MG PO TABS: 100.0000 mg | ORAL_TABLET | Freq: Two times a day (BID) | ORAL | 0 refills | Status: DC

## 2019-08-26 NOTE — Progress Notes (Signed)
Office Visit Note   Patient: Carrie Caldwell           Date of Birth: Nov 08, 1965           MRN: [id number] Visit Date: 08/26/2019              Requested by: Eartha Inch, MD [address] [place],  [place] [address] PCP: Eartha Inch, MD  Chief Complaint  Patient presents with  . Left Ankle - Routine Post Op    08/06/19 Left ankle talonavicular & subtalar fusion        HPI: This is a pleasant 54 year old woman who is 3 weeks status post left talonavicular and subtalar arthrodesis.  She has been using a fracture boot.  As well as a scooter.  She still has significant swelling and pain.  She is concerned that it might be infected.  She does notice that there is less swelling first thing in the morning  Assessment & Plan: Visit Diagnoses:  1. H/O ankle fusion     Plan: We will place the patient on a short course of doxycycline.  Encouraged elevation.  She was seen directly by Dr. Lajoyce Corners who placed the compression stocking on her foot.  Sutures will remain in place another week.    Follow-Up Instructions: No follow-ups on file.   Ortho Exam  Patient is alert, oriented, no adenopathy, well-dressed, normal affect, normal respiratory effort. Focused examination demonstrates moderate soft tissue swelling and inflammatory erythema.  No ascending cellulitis.  Sutures are in place.  Pulses are intact.  Compartments are soft and compressible.  She does have some hypersensitivity to touch on the top of her foot just a small amount of serous drainage  Imaging: No results found. No images are attached to the encounter.  Labs: Lab Results  Component Value Date   ESRSEDRATE 36 (H) 07/20/2018   LABURIC 7.2 (H) 07/20/2018     Lab Results  Component Value Date   ALBUMIN 4.2 08/14/2009   ALBUMIN 3.9 08/01/2008   LABURIC 7.2 (H) 07/20/2018    No results found for: MG No results found for: VD25OH  No results found for: PREALBUMIN CBC EXTENDED Latest Ref Rng & Units  08/06/2019 08/14/2009 08/01/2008  WBC 4.0 - 10.5 K/uL 8.2 7.0 9.3  RBC 3.87 - 5.11 MIL/uL 4.13 4.26 4.02  HGB 12.0 - 15.0 g/dL 11.6(L) 12.7 12.0  HCT 36 - 46 % 35.7(L) 36.5 34.7(L)  PLT 150 - 400 K/uL 318 285.0 292.0  NEUTROABS 1.4 - 7.7 K/uL - 4.2 6.7  LYMPHSABS 0.7 - 4.0 K/uL - 2.2 1.7     Body mass index is 41.01 kg/m.  Orders:  Orders Placed This Encounter  Procedures  . XR Ankle 2 Views Left   Meds ordered this encounter  Medications  . doxycycline (VIBRA-TABS) 100 MG tablet    Sig: Take 1 tablet (100 mg total) by mouth 2 (two) times daily.    Dispense:  14 tablet    Refill:  0  . traMADol (ULTRAM) 50 MG tablet    Sig: Take 2 tablets (100 mg total) by mouth every 6 (six) hours as needed for moderate pain.    Dispense:  30 tablet    Refill:  0     Procedures: No procedures performed  Clinical Data: No additional findings.  ROS:  All other systems negative, except as noted in the HPI. Review of Systems  Objective: Vital Signs: Ht 5' (1.524 m)   Wt 210  lb (95.3 kg)   LMP 03/08/2011   BMI 41.01 kg/m   Specialty Comments:  No specialty comments available.  PMFS History: Patient Active Problem List   Diagnosis Date Noted  . H/O ankle fusion 08/13/2019  . Posterior tibial tendon dysfunction, left   . Mass of soft tissue of foot 07/20/2018  . OME (otitis media with effusion) 04/27/2011  . RENAL CYST, LEFT 08/21/2009  . OBESITY 08/16/2008  . CHEST WALL PAIN, HX OF 04/05/2008  . MICROSCOPIC HEMATURIA 05/08/2007   Past Medical History:  Diagnosis Date  . Anemia    low iron in the past  . Cystic kidney disease    left  . History of kidney stones   . Microscopic hematuria     Family History  Problem Relation Age of Onset  . Diabetes Other   . Coronary artery disease Other   . Kidney disease Other   . Hematuria Other     Past Surgical History:  Procedure Laterality Date  . ANKLE FUSION Left 08/06/2019   Procedure: LEFT TALONAVICULAR AND SUBTALAR  FUSION;  Surgeon: Nadara Mustard, MD;  Location: [facility] OR;  Service: Orthopedics;  Laterality: Left;  . COLONOSCOPY     Social History   Occupational History  . Not on file  Tobacco Use  . Smoking status: Never Smoker  . Smokeless tobacco: Never Used  Vaping Use  . Vaping Use: Never used  Substance and Sexual Activity  . Alcohol use: Not Currently  . Drug use: Never  . Sexual activity: Not on file

## 2019-08-31 ENCOUNTER — Encounter: Payer: Self-pay | Admitting: Orthopedic Surgery

## 2019-08-31 NOTE — Progress Notes (Signed)
Office Visit Note   Patient: Carrie Caldwell           Date of Birth: 1966-01-05           MRN: [id number] Visit Date: 08/19/2019              Requested by: Eartha Inch, MD [address] [place],  [place] [address] PCP: Eartha Inch, MD  Chief Complaint  Patient presents with  . Left Ankle - Routine Post Op    08/06/19 Left ankle fusion      HPI: Patient presents 2 weeks status post left foot talonavicular and subtalar fusion patient states she has been having increasing pain and swelling.  Assessment & Plan: Visit Diagnoses:  1. H/O ankle fusion     Plan: Patient was given a prescription for Ultram she will start range of motion of the ankle recommended resuming her compression stocking with elevation.  Follow-Up Instructions: Return in about 1 week (around 08/26/2019).   Ortho Exam  Patient is alert, oriented, no adenopathy, well-dressed, normal affect, normal respiratory effort. Examination patient has massive swelling in the foot there is redness there is tension across the suture line no wound dehiscence no cellulitis no signs of infection.  Imaging: No results found. No images are attached to the encounter.  Labs: Lab Results  Component Value Date   ESRSEDRATE 36 (H) 07/20/2018   LABURIC 7.2 (H) 07/20/2018     Lab Results  Component Value Date   ALBUMIN 4.2 08/14/2009   ALBUMIN 3.9 08/01/2008   LABURIC 7.2 (H) 07/20/2018    No results found for: MG No results found for: VD25OH  No results found for: PREALBUMIN CBC EXTENDED Latest Ref Rng & Units 08/06/2019 08/14/2009 08/01/2008  WBC 4.0 - 10.5 K/uL 8.2 7.0 9.3  RBC 3.87 - 5.11 MIL/uL 4.13 4.26 4.02  HGB 12.0 - 15.0 g/dL 11.6(L) 12.7 12.0  HCT 36 - 46 % 35.7(L) 36.5 34.7(L)  PLT 150 - 400 K/uL 318 285.0 292.0  NEUTROABS 1.4 - 7.7 K/uL - 4.2 6.7  LYMPHSABS 0.7 - 4.0 K/uL - 2.2 1.7     Body mass index is 41.01 kg/m.  Orders:  Orders Placed This Encounter  Procedures  . XR Ankle 2  Views Left   Meds ordered this encounter  Medications  . DISCONTD: traMADol (ULTRAM) 50 MG tablet    Sig: Take 2 tablets (100 mg total) by mouth every 6 (six) hours as needed for moderate pain.    Dispense:  30 tablet    Refill:  0     Procedures: No procedures performed  Clinical Data: No additional findings.  ROS:  All other systems negative, except as noted in the HPI. Review of Systems  Objective: Vital Signs: Ht 5' (1.524 m)   Wt 210 lb (95.3 kg)   LMP 03/08/2011   BMI 41.01 kg/m   Specialty Comments:  No specialty comments available.  PMFS History: Patient Active Problem List   Diagnosis Date Noted  . H/O ankle fusion 08/13/2019  . Posterior tibial tendon dysfunction, left   . Mass of soft tissue of foot 07/20/2018  . OME (otitis media with effusion) 04/27/2011  . RENAL CYST, LEFT 08/21/2009  . OBESITY 08/16/2008  . CHEST WALL PAIN, HX OF 04/05/2008  . MICROSCOPIC HEMATURIA 05/08/2007   Past Medical History:  Diagnosis Date  . Anemia    low iron in the past  . Cystic kidney disease    left  .  History of kidney stones   . Microscopic hematuria     Family History  Problem Relation Age of Onset  . Diabetes Other   . Coronary artery disease Other   . Kidney disease Other   . Hematuria Other     Past Surgical History:  Procedure Laterality Date  . ANKLE FUSION Left 08/06/2019   Procedure: LEFT TALONAVICULAR AND SUBTALAR FUSION;  Surgeon: Nadara Mustard, MD;  Location: [facility] OR;  Service: Orthopedics;  Laterality: Left;  . COLONOSCOPY     Social History   Occupational History  . Not on file  Tobacco Use  . Smoking status: Never Smoker  . Smokeless tobacco: Never Used  Vaping Use  . Vaping Use: Never used  Substance and Sexual Activity  . Alcohol use: Not Currently  . Drug use: Never  . Sexual activity: Not on file

## 2019-09-02 ENCOUNTER — Encounter: Payer: Self-pay | Admitting: Orthopedic Surgery

## 2019-09-02 ENCOUNTER — Ambulatory Visit (INDEPENDENT_AMBULATORY_CARE_PROVIDER_SITE_OTHER): Payer: BC Managed Care – PPO | Admitting: Orthopedic Surgery

## 2019-09-02 VITALS — Ht 60.0 in | Wt 210.0 lb

## 2019-09-02 DIAGNOSIS — M76822 Posterior tibial tendinitis, left leg: Secondary | ICD-10-CM

## 2019-09-05 ENCOUNTER — Encounter: Payer: Self-pay | Admitting: Orthopedic Surgery

## 2019-09-05 NOTE — Progress Notes (Signed)
Office Visit Note   Patient: Carrie Caldwell           Date of Birth: 1965-08-16           MRN: [id number] Visit Date: 09/02/2019              Requested by: Eartha Inch, MD [address] [place],  [place] [address] PCP: Eartha Inch, MD  Chief Complaint  Patient presents with  . Left Foot - Routine Post Op    Left foot talonavicular and subtalar fusion       HPI: Patient is a 54 year old woman who presents 3 weeks status post left foot talonavicular subtalar fusion she is wearing compression socks using a kneeling scooter.  Assessment & Plan: Visit Diagnoses:  1. Posterior tibial tendinitis, left leg     Plan: Recommended continue with compression continue with elevation recommend a trial of Voltaren gel or CBD lotion.  Obtain three-view radiographs of the left foot at follow-up.  Follow-Up Instructions: Return in about 2 weeks (around 09/16/2019).   Ortho Exam  Patient is alert, oriented, no adenopathy, well-dressed, normal affect, normal respiratory effort. Examination patient does have significant decrease swelling the skin is wrinkling the wound edges are well approximated there is no redness no cellulitis no drainage no signs of infection we will harvest the sutures today.  Imaging: No results found. No images are attached to the encounter.  Labs: Lab Results  Component Value Date   ESRSEDRATE 36 (H) 07/20/2018   LABURIC 7.2 (H) 07/20/2018     Lab Results  Component Value Date   ALBUMIN 4.2 08/14/2009   ALBUMIN 3.9 08/01/2008   LABURIC 7.2 (H) 07/20/2018    No results found for: MG No results found for: VD25OH  No results found for: PREALBUMIN CBC EXTENDED Latest Ref Rng & Units 08/06/2019 08/14/2009 08/01/2008  WBC 4.0 - 10.5 K/uL 8.2 7.0 9.3  RBC 3.87 - 5.11 MIL/uL 4.13 4.26 4.02  HGB 12.0 - 15.0 g/dL 11.6(L) 12.7 12.0  HCT 36 - 46 % 35.7(L) 36.5 34.7(L)  PLT 150 - 400 K/uL 318 285.0 292.0  NEUTROABS 1.4 - 7.7 K/uL - 4.2 6.7  LYMPHSABS  0.7 - 4.0 K/uL - 2.2 1.7     Body mass index is 41.01 kg/m.  Orders:  No orders of the defined types were placed in this encounter.  No orders of the defined types were placed in this encounter.    Procedures: No procedures performed  Clinical Data: No additional findings.  ROS:  All other systems negative, except as noted in the HPI. Review of Systems  Objective: Vital Signs: Ht 5' (1.524 m)   Wt (!) 210 lb (95.3 kg)   LMP 03/08/2011   BMI 41.01 kg/m   Specialty Comments:  No specialty comments available.  PMFS History: Patient Active Problem List   Diagnosis Date Noted  . H/O ankle fusion 08/13/2019  . Posterior tibial tendon dysfunction, left   . Mass of soft tissue of foot 07/20/2018  . OME (otitis media with effusion) 04/27/2011  . RENAL CYST, LEFT 08/21/2009  . OBESITY 08/16/2008  . CHEST WALL PAIN, HX OF 04/05/2008  . MICROSCOPIC HEMATURIA 05/08/2007   Past Medical History:  Diagnosis Date  . Anemia    low iron in the past  . Cystic kidney disease    left  . History of kidney stones   . Microscopic hematuria     Family History  Problem Relation Age of Onset  .  Diabetes Other   . Coronary artery disease Other   . Kidney disease Other   . Hematuria Other     Past Surgical History:  Procedure Laterality Date  . ANKLE FUSION Left 08/06/2019   Procedure: LEFT TALONAVICULAR AND SUBTALAR FUSION;  Surgeon: Nadara Mustard, MD;  Location: [facility] OR;  Service: Orthopedics;  Laterality: Left;  . COLONOSCOPY     Social History   Occupational History  . Not on file  Tobacco Use  . Smoking status: Never Smoker  . Smokeless tobacco: Never Used  Vaping Use  . Vaping Use: Never used  Substance and Sexual Activity  . Alcohol use: Not Currently  . Drug use: Never  . Sexual activity: Not on file

## 2019-09-16 ENCOUNTER — Encounter: Payer: Self-pay | Admitting: Orthopedic Surgery

## 2019-09-16 ENCOUNTER — Ambulatory Visit (INDEPENDENT_AMBULATORY_CARE_PROVIDER_SITE_OTHER): Payer: BC Managed Care – PPO

## 2019-09-16 ENCOUNTER — Ambulatory Visit (INDEPENDENT_AMBULATORY_CARE_PROVIDER_SITE_OTHER): Payer: BC Managed Care – PPO | Admitting: Orthopedic Surgery

## 2019-09-16 DIAGNOSIS — Z981 Arthrodesis status: Secondary | ICD-10-CM | POA: Diagnosis not present

## 2019-09-16 DIAGNOSIS — M79671 Pain in right foot: Secondary | ICD-10-CM

## 2019-09-16 NOTE — Progress Notes (Signed)
Office Visit Note   Patient: Carrie Caldwell           Date of Birth: 09-09-1965           MRN: [id number] Visit Date: 09/16/2019              Requested by: Eartha Inch, MD [address] [place],  [place] [address] PCP: Eartha Inch, MD  Chief Complaint  Patient presents with  . Left Foot - Routine Post Op    08/06/19 left talonavicular & subtalar fusion      HPI: Patient is a 54 year old woman who presents 5 weeks status post left talonavicular and subtalar fusion she states she still has some pain and swelling she is currently ambulating with a kneeling scooter.  Assessment & Plan: Visit Diagnoses:  1. Right foot pain   2. H/O ankle fusion     Plan: Patient will start physical therapy for range of motion of the ankle she was also demonstrated how to stretch the Achilles.  She will work on strengthening with therapy advance to weightbearing as tolerated in the fracture boot and wean out of the fracture boot as she feels comfortable.  She is traveling to [place] recommended using the kneeling scooter as much as possible.  Follow-Up Instructions: No follow-ups on file.   Ortho Exam  Patient is alert, oriented, no adenopathy, well-dressed, normal affect, normal respiratory effort. Examination patient has mild swelling there is no redness no cellulitis the incisions are well-healed no drainage no signs of infection she has dorsiflexion to about 10 degrees past neutral.  Imaging: XR Foot Complete Left  Result Date: 09/16/2019 Three-view radiographs of the left foot shows a stable subtalar and talonavicular fusion.  No images are attached to the encounter.  Labs: Lab Results  Component Value Date   ESRSEDRATE 36 (H) 07/20/2018   LABURIC 7.2 (H) 07/20/2018     Lab Results  Component Value Date   ALBUMIN 4.2 08/14/2009   ALBUMIN 3.9 08/01/2008   LABURIC 7.2 (H) 07/20/2018    No results found for: MG No results found for: VD25OH  No results found for:  PREALBUMIN CBC EXTENDED Latest Ref Rng & Units 08/06/2019 08/14/2009 08/01/2008  WBC 4.0 - 10.5 K/uL 8.2 7.0 9.3  RBC 3.87 - 5.11 MIL/uL 4.13 4.26 4.02  HGB 12.0 - 15.0 g/dL 11.6(L) 12.7 12.0  HCT 36 - 46 % 35.7(L) 36.5 34.7(L)  PLT 150 - 400 K/uL 318 285.0 292.0  NEUTROABS 1.4 - 7.7 K/uL - 4.2 6.7  LYMPHSABS 0.7 - 4.0 K/uL - 2.2 1.7     There is no height or weight on file to calculate BMI.  Orders:  Orders Placed This Encounter  Procedures  . XR Foot Complete Left   No orders of the defined types were placed in this encounter.    Procedures: No procedures performed  Clinical Data: No additional findings.  ROS:  All other systems negative, except as noted in the HPI. Review of Systems  Objective: Vital Signs: LMP 03/08/2011   Specialty Comments:  No specialty comments available.  PMFS History: Patient Active Problem List   Diagnosis Date Noted  . H/O ankle fusion 08/13/2019  . Posterior tibial tendon dysfunction, left   . Mass of soft tissue of foot 07/20/2018  . OME (otitis media with effusion) 04/27/2011  . RENAL CYST, LEFT 08/21/2009  . OBESITY 08/16/2008  . CHEST WALL PAIN, HX OF 04/05/2008  . MICROSCOPIC HEMATURIA 05/08/2007   Past  Medical History:  Diagnosis Date  . Anemia    low iron in the past  . Cystic kidney disease    left  . History of kidney stones   . Microscopic hematuria     Family History  Problem Relation Age of Onset  . Diabetes Other   . Coronary artery disease Other   . Kidney disease Other   . Hematuria Other     Past Surgical History:  Procedure Laterality Date  . ANKLE FUSION Left 08/06/2019   Procedure: LEFT TALONAVICULAR AND SUBTALAR FUSION;  Surgeon: Nadara Mustard, MD;  Location: [facility] OR;  Service: Orthopedics;  Laterality: Left;  . COLONOSCOPY     Social History   Occupational History  . Not on file  Tobacco Use  . Smoking status: Never Smoker  . Smokeless tobacco: Never Used  Vaping Use  . Vaping Use: Never used    Substance and Sexual Activity  . Alcohol use: Not Currently  . Drug use: Never  . Sexual activity: Not on file

## 2019-10-07 ENCOUNTER — Ambulatory Visit (INDEPENDENT_AMBULATORY_CARE_PROVIDER_SITE_OTHER): Payer: BC Managed Care – PPO

## 2019-10-07 ENCOUNTER — Other Ambulatory Visit: Payer: Self-pay

## 2019-10-07 ENCOUNTER — Encounter: Payer: Self-pay | Admitting: Orthopedic Surgery

## 2019-10-07 ENCOUNTER — Ambulatory Visit (INDEPENDENT_AMBULATORY_CARE_PROVIDER_SITE_OTHER): Payer: BC Managed Care – PPO | Admitting: Orthopedic Surgery

## 2019-10-07 VITALS — Ht 60.0 in | Wt 210.0 lb

## 2019-10-07 DIAGNOSIS — M79672 Pain in left foot: Secondary | ICD-10-CM | POA: Diagnosis not present

## 2019-10-07 DIAGNOSIS — M76822 Posterior tibial tendinitis, left leg: Secondary | ICD-10-CM

## 2019-10-08 ENCOUNTER — Encounter: Payer: Self-pay | Admitting: Orthopedic Surgery

## 2019-10-08 NOTE — Progress Notes (Signed)
Office Visit Note   Patient: Carrie Caldwell           Date of Birth: Feb 09, 1965           MRN: [id number] Visit Date: 10/07/2019              Requested by: Eartha Inch, MD [address] [place],  [place] [address] PCP: Eartha Inch, MD  Chief Complaint  Patient presents with  . Left Foot - Routine Post Op    08/06/19 left subtalar and talonavicular fusion       HPI: Patient is a 54 year old woman who is 2 months status post left talonavicular and subtalar fusion she has been nonweightbearing in a fracture boot with a kneeling scooter.  She is doing therapy with Jonny Ruiz.  Assessment & Plan: Visit Diagnoses:  1. Pain in left foot   2. Posterior tibial tendinitis, left leg     Plan: Continue physical therapy she may begin weightbearing as tolerated in the fracture boot once she is comfortable with weightbearing with a fracture boot she will advance to regular shoewear.  Follow-Up Instructions: No follow-ups on file.   Ortho Exam  Patient is alert, oriented, no adenopathy, well-dressed, norm patient has good range of motion of her ankle.-year-old no cellulitis no drainage no signs of infection al affect, normal respiratory effort. Examination patient has decreased swelling the incisions are well-healed  Imaging: No results found. No images are attached to the encounter.  Labs: Lab Results  Component Value Date   ESRSEDRATE 36 (H) 07/20/2018   LABURIC 7.2 (H) 07/20/2018     Lab Results  Component Value Date   ALBUMIN 4.2 08/14/2009   ALBUMIN 3.9 08/01/2008   LABURIC 7.2 (H) 07/20/2018    No results found for: MG No results found for: VD25OH  No results found for: PREALBUMIN CBC EXTENDED Latest Ref Rng & Units 08/06/2019 08/14/2009 08/01/2008  WBC 4.0 - 10.5 K/uL 8.2 7.0 9.3  RBC 3.87 - 5.11 MIL/uL 4.13 4.26 4.02  HGB 12.0 - 15.0 g/dL 11.6(L) 12.7 12.0  HCT 36 - 46 % 35.7(L) 36.5 34.7(L)  PLT 150 - 400 K/uL 318 285.0 292.0  NEUTROABS 1.4 - 7.7 K/uL -  4.2 6.7  LYMPHSABS 0.7 - 4.0 K/uL - 2.2 1.7     Body mass index is 41.01 kg/m.  Orders:  Orders Placed This Encounter  Procedures  . XR Foot Complete Left   No orders of the defined types were placed in this encounter.    Procedures: No procedures performed  Clinical Data: No additional findings.  ROS:  All other systems negative, except as noted in the HPI. Review of Systems  Objective: Vital Signs: Ht 5' (1.524 m)   Wt 210 lb (95.3 kg)   LMP 03/08/2011   BMI 41.01 kg/m   Specialty Comments:  No specialty comments available.  PMFS History: Patient Active Problem List   Diagnosis Date Noted  . H/O ankle fusion 08/13/2019  . Posterior tibial tendon dysfunction, left   . Mass of soft tissue of foot 07/20/2018  . OME (otitis media with effusion) 04/27/2011  . RENAL CYST, LEFT 08/21/2009  . OBESITY 08/16/2008  . CHEST WALL PAIN, HX OF 04/05/2008  . MICROSCOPIC HEMATURIA 05/08/2007   Past Medical History:  Diagnosis Date  . Anemia    low iron in the past  . Cystic kidney disease    left  . History of kidney stones   . Microscopic hematuria  Family History  Problem Relation Age of Onset  . Diabetes Other   . Coronary artery disease Other   . Kidney disease Other   . Hematuria Other     Past Surgical History:  Procedure Laterality Date  . ANKLE FUSION Left 08/06/2019   Procedure: LEFT TALONAVICULAR AND SUBTALAR FUSION;  Surgeon: Nadara Mustard, MD;  Location: [facility] OR;  Service: Orthopedics;  Laterality: Left;  . COLONOSCOPY     Social History   Occupational History  . Not on file  Tobacco Use  . Smoking status: Never Smoker  . Smokeless tobacco: Never Used  Vaping Use  . Vaping Use: Never used  Substance and Sexual Activity  . Alcohol use: Not Currently  . Drug use: Never  . Sexual activity: Not on file

## 2019-10-28 ENCOUNTER — Encounter: Payer: Self-pay | Admitting: Physician Assistant

## 2019-10-28 ENCOUNTER — Ambulatory Visit (INDEPENDENT_AMBULATORY_CARE_PROVIDER_SITE_OTHER): Payer: BC Managed Care – PPO | Admitting: Physician Assistant

## 2019-10-28 VITALS — Ht 60.0 in | Wt 210.0 lb

## 2019-10-28 DIAGNOSIS — Z981 Arthrodesis status: Secondary | ICD-10-CM

## 2019-10-28 MED ORDER — MELOXICAM 7.5 MG PO TABS
7.5000 mg | ORAL_TABLET | Freq: Every day | ORAL | 1 refills | Status: DC
Start: 2019-10-28 — End: 2019-12-20

## 2019-10-28 NOTE — Progress Notes (Signed)
Office Visit Note   Patient: Carrie Caldwell           Date of Birth: 1965-08-13           MRN: [id number] Visit Date: 10/28/2019              Requested by: Eartha Inch, MD [address] [place],  [place] [address] PCP: Eartha Inch, MD  Chief Complaint  Patient presents with  . Left Ankle - Routine Post Op    08/06/19 left subtalar and talonavicular fusion       HPI: Patient presents today 2-1/2 months status post left subtalar and talonavicular arthrodesis.  At her last visit she was advised that she can start weightbearing as tolerated in her boot.  She has begun to do this.  She has noticed some increase in swelling and pain.  She has been working with physical therapy.  They actually had her go back on her scooter to relieve some of her swelling.  Currently the medication she takes is Tylenol and Naprosyn as needed.  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  Plan: We will try her on a regular course of meloxicam.  Asked that she not take Naprosyn at the same time.  Follow-up in 1 month.  Will speak with her physical therapist if he has any other recommendations  Follow-Up Instructions: No follow-ups on file.   Ortho Exam  Patient is alert, oriented, no adenopathy, well-dressed, normal affect, normal respiratory effort. Focused examination of her foot demonstrates well-healed surgical incisions no cellulitis.  She does have some mild to moderate swelling.  She is wearing her compression stocking.  Pulses are intact.  Imaging: No results found. No images are attached to the encounter.  Labs: Lab Results  Component Value Date   ESRSEDRATE 36 (H) 07/20/2018   LABURIC 7.2 (H) 07/20/2018     Lab Results  Component Value Date   ALBUMIN 4.2 08/14/2009   ALBUMIN 3.9 08/01/2008   LABURIC 7.2 (H) 07/20/2018    No results found for: MG No  results found for: VD25OH  No results found for: PREALBUMIN CBC EXTENDED Latest Ref Rng & Units 08/06/2019 08/14/2009 08/01/2008  WBC 4.0 - 10.5 K/uL 8.2 7.0 9.3  RBC 3.87 - 5.11 MIL/uL 4.13 4.26 4.02  HGB 12.0 - 15.0 g/dL 11.6(L) 12.7 12.0  HCT 36 - 46 % 35.7(L) 36.5 34.7(L)  PLT 150 - 400 K/uL 318 285.0 292.0  NEUTROABS 1.4 - 7.7 K/uL - 4.2 6.7  LYMPHSABS 0.7 - 4.0 K/uL - 2.2 1.7     Body mass index is 41.01 kg/m.  Orders:  No orders of the defined types were placed in this encounter.  Meds ordered this encounter  Medications  . meloxicam (MOBIC) 7.5 MG tablet    Sig: Take 1 tablet (7.5 mg total) by mouth daily.    Dispense:  30 tablet    Refill:  1     Procedures: No procedures performed  Clinical Data: No additional findings.  ROS:  All other systems negative, except as noted in the HPI. Review of Systems  Objective: Vital Signs: Ht 5' (1.524 m)   Wt 210 lb (95.3 kg)   LMP 03/08/2011   BMI 41.01 kg/m   Specialty Comments:  No specialty comments available.  PMFS History: Patient Active Problem List   Diagnosis  Date Noted  . H/O ankle fusion 08/13/2019  . Posterior tibial tendon dysfunction, left   . Mass of soft tissue of foot 07/20/2018  . OME (otitis media with effusion) 04/27/2011  . RENAL CYST, LEFT 08/21/2009  . OBESITY 08/16/2008  . CHEST WALL PAIN, HX OF 04/05/2008  . MICROSCOPIC HEMATURIA 05/08/2007   Past Medical History:  Diagnosis Date  . Anemia    low iron in the past  . Cystic kidney disease    left  . History of kidney stones   . Microscopic hematuria     Family History  Problem Relation Age of Onset  . Diabetes Other   . Coronary artery disease Other   . Kidney disease Other   . Hematuria Other     Past Surgical History:  Procedure Laterality Date  . ANKLE FUSION Left 08/06/2019   Procedure: LEFT TALONAVICULAR AND SUBTALAR FUSION;  Surgeon: Nadara Mustard, MD;  Location: [facility] OR;  Service: Orthopedics;  Laterality: Left;  .  COLONOSCOPY     Social History   Occupational History  . Not on file  Tobacco Use  . Smoking status: Never Smoker  . Smokeless tobacco: Never Used  Vaping Use  . Vaping Use: Never used  Substance and Sexual Activity  . Alcohol use: Not Currently  . Drug use: Never  . Sexual activity: Not on file

## 2019-11-25 ENCOUNTER — Ambulatory Visit (INDEPENDENT_AMBULATORY_CARE_PROVIDER_SITE_OTHER): Payer: BC Managed Care – PPO | Admitting: Orthopedic Surgery

## 2019-11-25 ENCOUNTER — Ambulatory Visit: Payer: Self-pay

## 2019-11-25 ENCOUNTER — Encounter: Payer: Self-pay | Admitting: Physician Assistant

## 2019-11-25 VITALS — Ht 60.0 in | Wt 210.0 lb

## 2019-11-25 DIAGNOSIS — M79672 Pain in left foot: Secondary | ICD-10-CM | POA: Diagnosis not present

## 2019-11-25 DIAGNOSIS — M76822 Posterior tibial tendinitis, left leg: Secondary | ICD-10-CM

## 2019-11-25 NOTE — Progress Notes (Signed)
Office Visit Note   Patient: Carrie Caldwell           Date of Birth: [birth year]           MRN: [id number] Visit Date: 11/25/2019              Requested by: Eartha Inch, MD [address] [place],  [place] [address] PCP: Eartha Inch, MD  Chief Complaint  Patient presents with  . Left Ankle - Routine Post Op    08/06/19 left subtalar and talonavicular fusion       HPI: Patient is a 54 year old woman who presents status post subtalar and talonavicular fusion.  She has been very active at work has been unable to get off her feet but feels like she is making progress she is currently using a cane in the fracture boot.  Assessment & Plan: Visit Diagnoses:  1. Pain in left foot   2. Posterior tibial tendinitis, left leg     Plan: Discussed that she could advance to a stiff soled Trail running sneaker such as [employer] continue to increase her activities as tolerated.  Follow-Up Instructions: No follow-ups on file.   Ortho Exam  Patient is alert, oriented, no adenopathy, well-dressed, normal affect, normal respiratory effort. Examination she still has some swelling recommended continue with her compression socks the incisions have healed nicely no complicating features no cellulitis no signs of infection.  She has good dorsiflexion of the ankle.  Imaging: XR Foot Complete Left  Result Date: 11/25/2019 Three-view radiographs of the left foot shows stable internal fixation with good healing of the talonavicular and subtalar fusion with out hardware failure.  No images are attached to the encounter.  Labs: Lab Results  Component Value Date   ESRSEDRATE 36 (H) 07/20/2018   LABURIC 7.2 (H) 07/20/2018     Lab Results  Component Value Date   ALBUMIN 4.2 08/14/2009   ALBUMIN 3.9 08/01/2008   LABURIC 7.2 (H) 07/20/2018    No results found for: MG No results found for: VD25OH  No results found for: PREALBUMIN CBC EXTENDED Latest Ref Rng & Units 08/06/2019 08/14/2009  08/01/2008  WBC 4.0 - 10.5 K/uL 8.2 7.0 9.3  RBC 3.87 - 5.11 MIL/uL 4.13 4.26 4.02  HGB 12.0 - 15.0 g/dL 11.6(L) 12.7 12.0  HCT 36 - 46 % 35.7(L) 36.5 34.7(L)  PLT 150 - 400 K/uL 318 285.0 292.0  NEUTROABS 1.4 - 7.7 K/uL - 4.2 6.7  LYMPHSABS 0.7 - 4.0 K/uL - 2.2 1.7     Body mass index is 41.01 kg/m.  Orders:  Orders Placed This Encounter  Procedures  . XR Foot Complete Left   No orders of the defined types were placed in this encounter.    Procedures: No procedures performed  Clinical Data: No additional findings.  ROS:  All other systems negative, except as noted in the HPI. Review of Systems  Objective: Vital Signs: Ht 5' (1.524 m)   Wt 210 lb (95.3 kg)   LMP 03/08/2011   BMI 41.01 kg/m   Specialty Comments:  No specialty comments available.  PMFS History: Patient Active Problem List   Diagnosis Date Noted  . H/O ankle fusion 08/13/2019  . Posterior tibial tendon dysfunction, left   . Mass of soft tissue of foot 07/20/2018  . OME (otitis media with effusion) 04/27/2011  . RENAL CYST, LEFT 08/21/2009  . OBESITY 08/16/2008  . CHEST WALL PAIN, HX OF 04/05/2008  . MICROSCOPIC HEMATURIA 05/08/2007  Past Medical History:  Diagnosis Date  . Anemia    low iron in the past  . Cystic kidney disease    left  . History of kidney stones   . Microscopic hematuria     Family History  Problem Relation Age of Onset  . Diabetes Other   . Coronary artery disease Other   . Kidney disease Other   . Hematuria Other     Past Surgical History:  Procedure Laterality Date  . ANKLE FUSION Left 08/06/2019   Procedure: LEFT TALONAVICULAR AND SUBTALAR FUSION;  Surgeon: Nadara Mustard, MD;  Location: [facility] OR;  Service: Orthopedics;  Laterality: Left;  . COLONOSCOPY     Social History   Occupational History  . Not on file  Tobacco Use  . Smoking status: Never Smoker  . Smokeless tobacco: Never Used  Vaping Use  . Vaping Use: Never used  Substance and Sexual  Activity  . Alcohol use: Not Currently  . Drug use: Never  . Sexual activity: Not on file

## 2019-12-20 ENCOUNTER — Other Ambulatory Visit: Payer: Self-pay | Admitting: Physician Assistant

## 2019-12-23 ENCOUNTER — Encounter: Payer: Self-pay | Admitting: Orthopedic Surgery

## 2019-12-23 ENCOUNTER — Ambulatory Visit (INDEPENDENT_AMBULATORY_CARE_PROVIDER_SITE_OTHER): Payer: BC Managed Care – PPO | Admitting: Orthopedic Surgery

## 2019-12-23 DIAGNOSIS — M76822 Posterior tibial tendinitis, left leg: Secondary | ICD-10-CM

## 2019-12-23 NOTE — Progress Notes (Signed)
Office Visit Note   Patient: Carrie Caldwell           Date of Birth: Jan 06, 1966           MRN: [id number] Visit Date: 12/23/2019              Requested by: Eartha Inch, MD [address] [place],  [place] [address] PCP: Eartha Inch, MD  Chief Complaint  Patient presents with  . Left Foot - Follow-up    08/06/19 left subtalar and talonavicular fusion       HPI: Patient is 4 months status post left subtalar talonavicular fusion.  She has been going to physical therapy she still has difficulty with balance and strength.  She said she has been to about 30 therapy sessions.  She is currently wearing a stiff soled [place] sneaker with knee-high compression socks.  She states she does have start up stiffness in the morning and after prolonged sitting.  Assessment & Plan: Visit Diagnoses:  1. Posterior tibial tendinitis, left leg     Plan: Patient was given instructions and demonstrated intrinsic strengthening for her foot.  Discussed that we will increase exercises as she improves.  Follow-Up Instructions: Return if symptoms worsen or fail to improve.   Ortho Exam  Patient is alert, oriented, no adenopathy, well-dressed, normal affect, normal respiratory effort. Examination patient has good dorsiflexion of the ankle to neutral the subtalar joint is stable her foot is plantigrade she does have a limp with ambulation she is not using a cane or any assistive device  Imaging: No results found. No images are attached to the encounter.  Labs: Lab Results  Component Value Date   ESRSEDRATE 36 (H) 07/20/2018   LABURIC 7.2 (H) 07/20/2018     Lab Results  Component Value Date   ALBUMIN 4.2 08/14/2009   ALBUMIN 3.9 08/01/2008   LABURIC 7.2 (H) 07/20/2018    No results found for: MG No results found for: VD25OH  No results found for: PREALBUMIN CBC EXTENDED Latest Ref Rng & Units 08/06/2019 08/14/2009 08/01/2008  WBC 4.0 - 10.5 K/uL 8.2 7.0 9.3  RBC 3.87 - 5.11 MIL/uL  4.13 4.26 4.02  HGB 12.0 - 15.0 g/dL 11.6(L) 12.7 12.0  HCT 36 - 46 % 35.7(L) 36.5 34.7(L)  PLT 150 - 400 K/uL 318 285.0 292.0  NEUTROABS 1.4 - 7.7 K/uL - 4.2 6.7  LYMPHSABS 0.7 - 4.0 K/uL - 2.2 1.7     There is no height or weight on file to calculate BMI.  Orders:  No orders of the defined types were placed in this encounter.  No orders of the defined types were placed in this encounter.    Procedures: No procedures performed  Clinical Data: No additional findings.  ROS:  All other systems negative, except as noted in the HPI. Review of Systems  Objective: Vital Signs: LMP 03/08/2011   Specialty Comments:  No specialty comments available.  PMFS History: Patient Active Problem List   Diagnosis Date Noted  . H/O ankle fusion 08/13/2019  . Posterior tibial tendon dysfunction, left   . Mass of soft tissue of foot 07/20/2018  . OME (otitis media with effusion) 04/27/2011  . RENAL CYST, LEFT 08/21/2009  . OBESITY 08/16/2008  . CHEST WALL PAIN, HX OF 04/05/2008  . MICROSCOPIC HEMATURIA 05/08/2007   Past Medical History:  Diagnosis Date  . Anemia    low iron in the past  . Cystic kidney disease    left  .  History of kidney stones   . Microscopic hematuria     Family History  Problem Relation Age of Onset  . Diabetes Other   . Coronary artery disease Other   . Kidney disease Other   . Hematuria Other     Past Surgical History:  Procedure Laterality Date  . ANKLE FUSION Left 08/06/2019   Procedure: LEFT TALONAVICULAR AND SUBTALAR FUSION;  Surgeon: Nadara Mustard, MD;  Location: [facility] OR;  Service: Orthopedics;  Laterality: Left;  . COLONOSCOPY     Social History   Occupational History  . Not on file  Tobacco Use  . Smoking status: Never Smoker  . Smokeless tobacco: Never Used  Vaping Use  . Vaping Use: Never used  Substance and Sexual Activity  . Alcohol use: Not Currently  . Drug use: Never  . Sexual activity: Not on file

## 2020-04-08 ENCOUNTER — Other Ambulatory Visit: Payer: Self-pay | Admitting: Orthopedic Surgery

## 2020-04-08 MED ORDER — MELOXICAM 7.5 MG PO TABS
7.5000 mg | ORAL_TABLET | Freq: Every day | ORAL | 3 refills | Status: DC
Start: 2020-04-08 — End: 2020-06-14

## 2020-06-14 ENCOUNTER — Other Ambulatory Visit: Payer: Self-pay | Admitting: Physician Assistant

## 2020-06-14 MED ORDER — MELOXICAM 7.5 MG PO TABS: 7.5000 mg | ORAL_TABLET | Freq: Every day | ORAL | 3 refills | Status: DC

## 2020-11-29 ENCOUNTER — Other Ambulatory Visit: Payer: Self-pay | Admitting: Orthopedic Surgery

## 2020-11-29 MED ORDER — MELOXICAM 7.5 MG PO TABS: 7.5000 mg | ORAL_TABLET | Freq: Every day | ORAL | 3 refills | Status: DC

## 2021-04-03 IMAGING — MR MR ANKLE*L* W/O CM
4 of 5 series · 13 of 40 positions shown · non-contrast
Comparison: Radiographs dated 10/08/2018

CLINICAL DATA: Left ankle pain for 2 months.

EXAM:
MRI OF THE LEFT ANKLE WITHOUT CONTRAST
TECHNIQUE: Multiplanar, multisequence MR imaging of the ankle was performed. No
intravenous contrast was administered.

[Series 3: PD fat-sat · axial · left · 3.0mm · 0.25mm/px · z∈[-121,-26]mm · 4 of 30 slices shown]
[im 1/30]
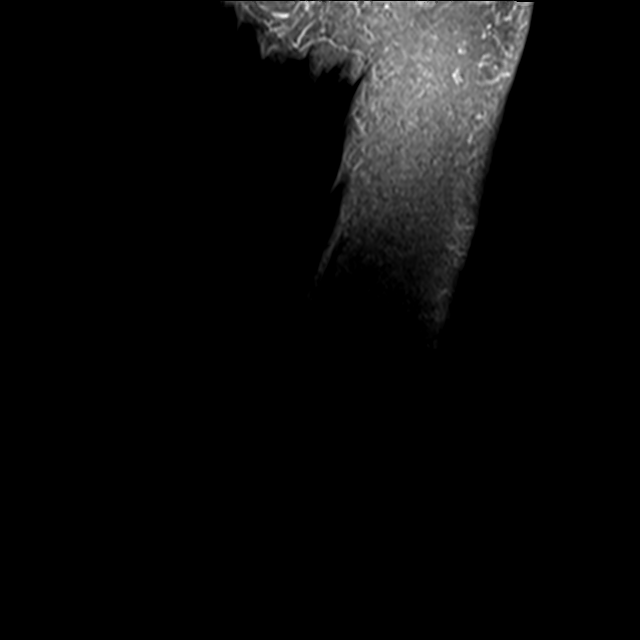
[im 5/30]
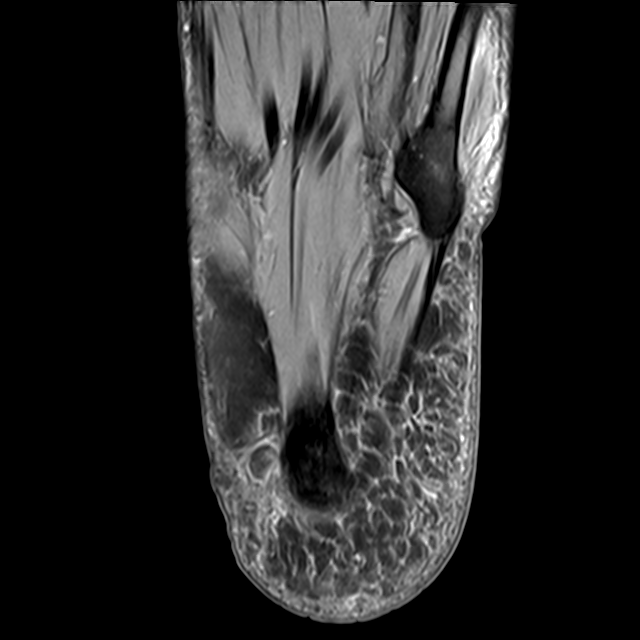
[im 17/30]
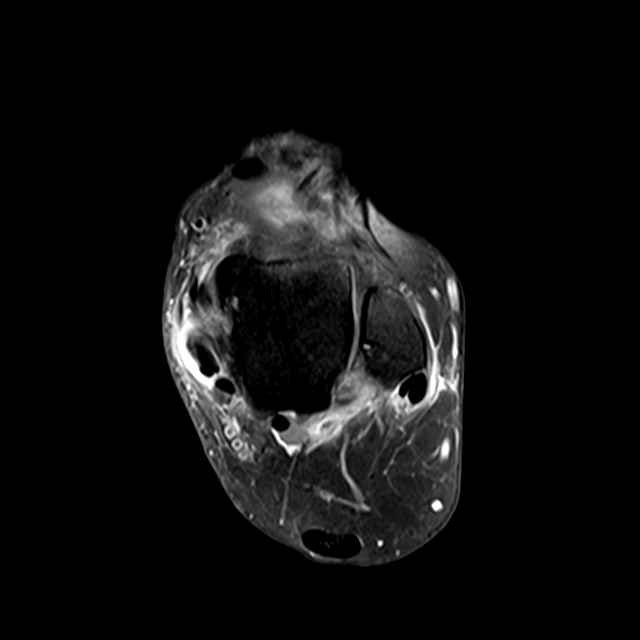
[im 25/30]
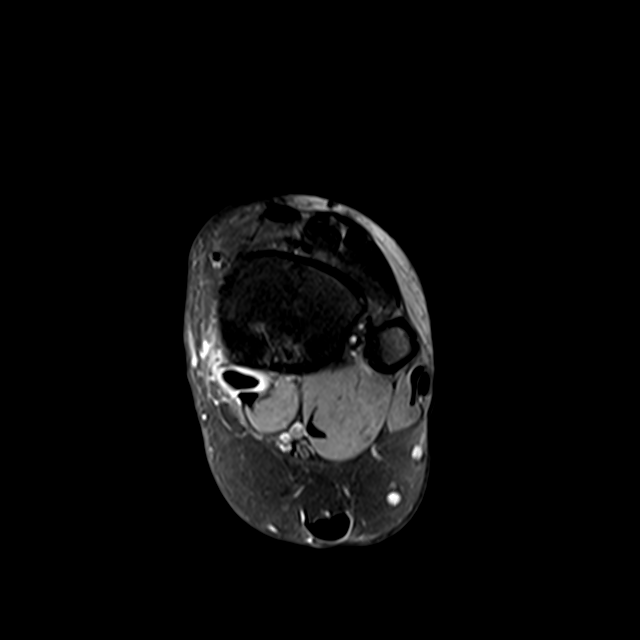

[Series 4: T2 fat-sat · axial · left · 3.0mm · 0.25mm/px · z∈[-105,-26]mm · 3 of 30 slices shown (1 of 2)]
[im 5/30]
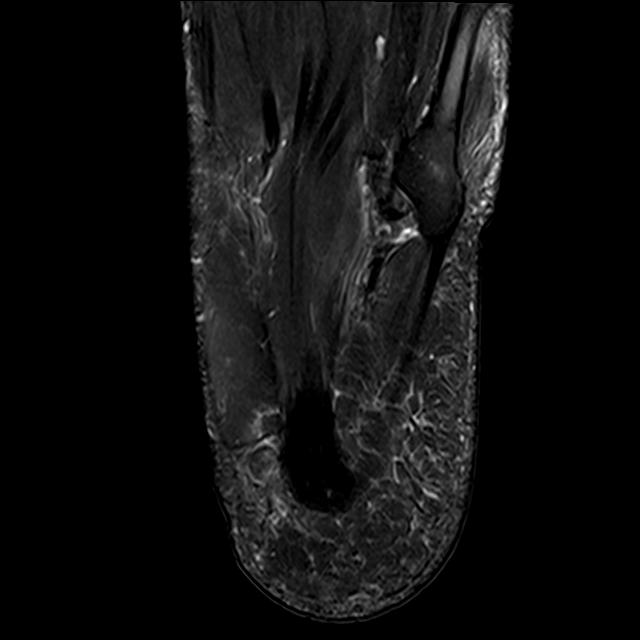
[im 17/30]
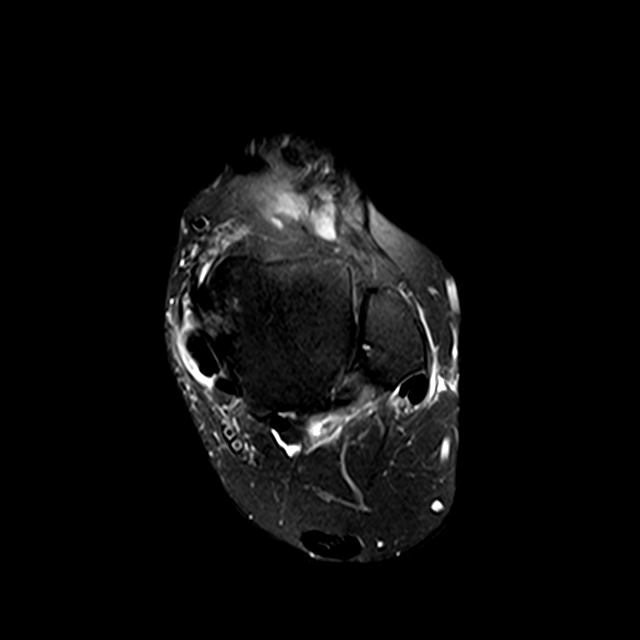
[im 25/30]
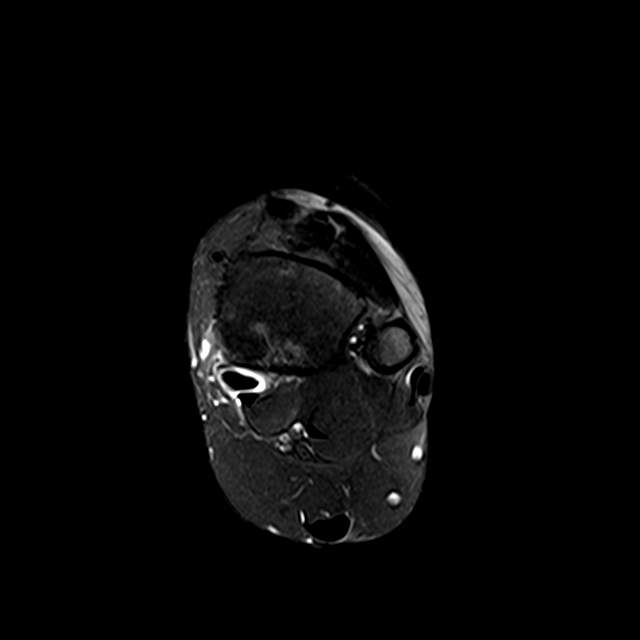

[Series 5: T1 · sagittal · left · 4.0mm · 0.27mm/px · 3 of 24 slices shown]
[im 4/24]
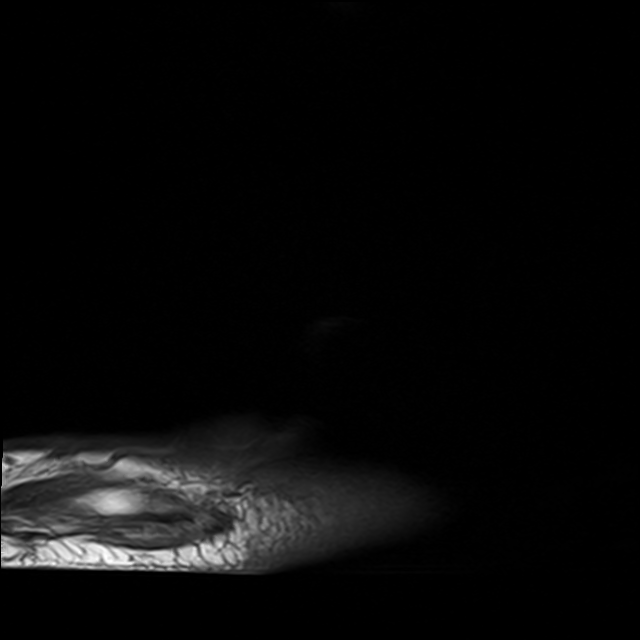
[im 12/24]
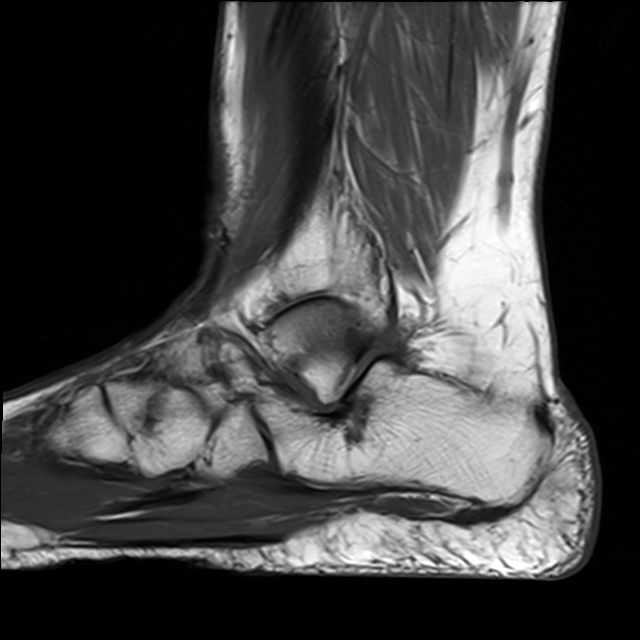
[im 20/24]
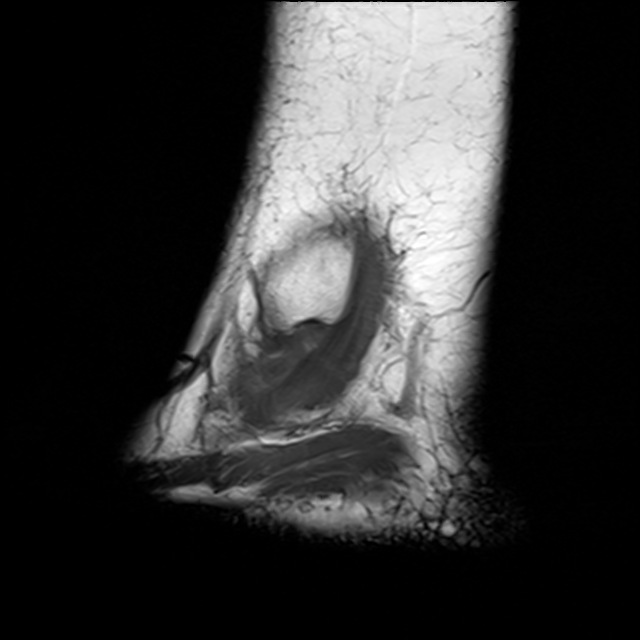

[Series 7: T2 fat-sat · coronal · left · 3.0mm · 0.25mm/px · 3 of 38 slices shown (2 of 2)]
[im 4/38]
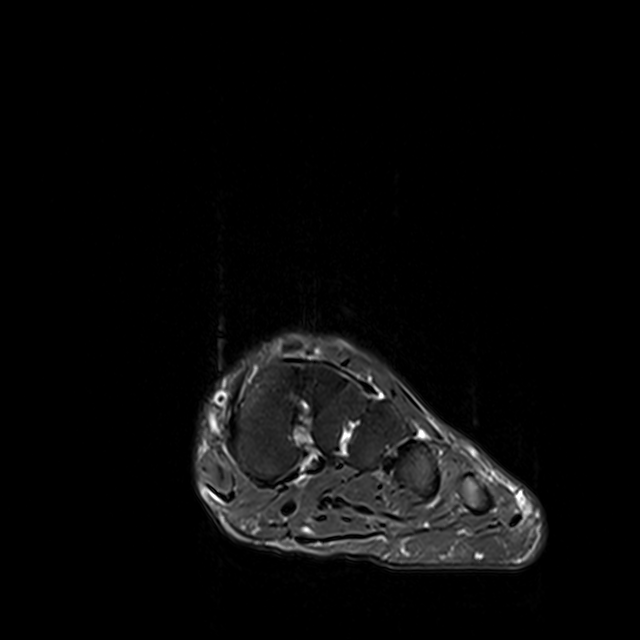
[im 19/38]
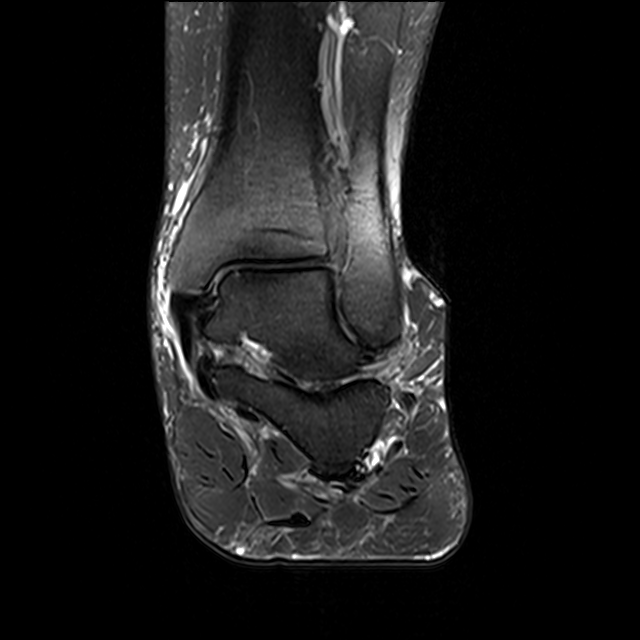
[im 34/38]
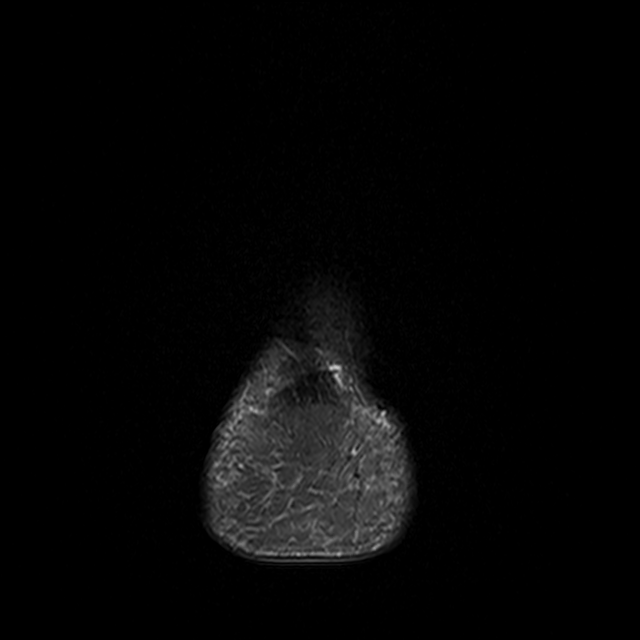

[13 of 40 positions shown; findings below may reference images not displayed]

FINDINGS: TENDONS

Peroneal: The tendons are intact but there is tenosynovitis at the
level of the lateral malleolus.

Posteromedial: There is tenosynovitis of the posterior tibialis and
flexor digitorum longus tendons.

Anterior: Normal.

Achilles: Normal.

Plantar Fascia: Normal.

LIGAMENTS

Lateral: Intact.

Medial: Intact.

CARTILAGE

Ankle Joint: Small focal areas of cartilage loss of the posterior
aspect of the tibia. No significant effusion. Slight edema in the
medial aspect of the distal tibia.

Subtalar Joints/Sinus Tarsi: Normal. Cystic changes in the calcaneus
at the angle of [place].

Bones: The patient appears to have a flatfoot deformity. However,
the spring ligament appears to be intact.

Other:
IMPRESSION: 1. Tenosynovitis of the posterior tibialis and flexor digitorum
longus tendons.
2. Tenosynovitis of the peroneal tendons at the level of the lateral
malleolus.
3. Slight arthritic changes of the posterior aspect of the
tibiotalar joint.

## 2021-11-22 ENCOUNTER — Other Ambulatory Visit: Payer: Self-pay | Admitting: Obstetrics and Gynecology

## 2021-11-22 DIAGNOSIS — Z8249 Family history of ischemic heart disease and other diseases of the circulatory system: Secondary | ICD-10-CM

## 2022-01-10 ENCOUNTER — Other Ambulatory Visit: Payer: Self-pay

## 2022-02-19 ENCOUNTER — Other Ambulatory Visit: Payer: Self-pay

## 2022-02-25 ENCOUNTER — Encounter: Payer: Self-pay | Admitting: Orthopedic Surgery

## 2022-02-25 ENCOUNTER — Ambulatory Visit (INDEPENDENT_AMBULATORY_CARE_PROVIDER_SITE_OTHER): Payer: BC Managed Care – PPO

## 2022-02-25 ENCOUNTER — Ambulatory Visit (INDEPENDENT_AMBULATORY_CARE_PROVIDER_SITE_OTHER): Payer: BC Managed Care – PPO | Admitting: Orthopedic Surgery

## 2022-02-25 DIAGNOSIS — M76822 Posterior tibial tendinitis, left leg: Secondary | ICD-10-CM | POA: Diagnosis not present

## 2022-03-05 ENCOUNTER — Encounter: Payer: Self-pay | Admitting: Orthopedic Surgery

## 2022-03-05 NOTE — Progress Notes (Signed)
Office Visit Note   Patient: Carrie Caldwell           Date of Birth: [birth year]           MRN: [id number] Visit Date: 02/25/2022              Requested by: Chesley Noon, MD [address] [place],  [place] [address] PCP: Chesley Noon, MD  Chief Complaint  Patient presents with   Left Foot - Follow-up      HPI: Patient presents for evaluation of acute left foot pain with start up.  She is status post subtalar and talonavicular fusion July 2021.  Patient states that the pain has been getting worse over the last couple of months with swelling.   Assessment & Plan: Visit Diagnoses:  1. Posterior tibial tendon dysfunction, left     Plan: Recommended sole orthotics to evenly distribute pressure over the plantar aspect of her foot.  Follow-Up Instructions: No follow-ups on file.   Ortho Exam  Patient is alert, oriented, no adenopathy, well-dressed, normal affect, normal respiratory effort.  Examination patient is tender to palpation over the peroneal tendons there is no swelling no redness.  No pain with passive range of motion of the ankle.  Patient has good range of motion of the great toe no pain with passive range of motion.  No redness or swelling around any of the joints.   Imaging: No results found. No images are attached to the encounter.  Labs: Lab Results  Component Value Date   ESRSEDRATE 36 (H) 07/20/2018   LABURIC 7.2 (H) 07/20/2018     Lab Results  Component Value Date   ALBUMIN 4.2 08/14/2009   ALBUMIN 3.9 08/01/2008    No results found for: "MG" No results found for: "VD25OH"  No results found for: "PREALBUMIN"    Latest Ref Rng & Units 08/06/2019    5:54 AM 08/14/2009    8:48 AM 08/01/2008    8:16 AM  CBC EXTENDED  WBC 4.0 - 10.5 K/uL 8.2  7.0  9.3   RBC 3.87 - 5.11 MIL/uL 4.13  4.26  4.02   Hemoglobin 12.0 - 15.0 g/dL 11.6  12.7  12.0   HCT 36.0 - 46.0 % 35.7  36.5  34.7   Platelets 150 - 400 K/uL 318  285.0  292.0    NEUT# 1.4 - 7.7 K/uL  4.2  6.7   Lymph# 0.7 - 4.0 K/uL  2.2  1.7      There is no height or weight on file to calculate BMI.  Orders:  Orders Placed This Encounter  Procedures   XR Foot Complete Left   No orders of the defined types were placed in this encounter.    Procedures: No procedures performed  Clinical Data: No additional findings.  ROS:  All other systems negative, except as noted in the HPI. Review of Systems  Objective: Vital Signs: LMP 03/08/2011   Specialty Comments:  No specialty comments available.  PMFS History: Patient Active Problem List   Diagnosis Date Noted   H/O ankle fusion 08/13/2019   Posterior tibial tendon dysfunction, left    Mass of soft tissue of foot 07/20/2018   OME (otitis media with effusion) 04/27/2011   RENAL CYST, LEFT 08/21/2009   OBESITY 08/16/2008   CHEST WALL PAIN, HX OF 04/05/2008   MICROSCOPIC HEMATURIA 05/08/2007   Past Medical History:  Diagnosis Date   Anemia    low iron in  the past   Cystic kidney disease    left   History of kidney stones    Microscopic hematuria     Family History  Problem Relation Age of Onset   Diabetes Other    Coronary artery disease Other    Kidney disease Other    Hematuria Other     Past Surgical History:  Procedure Laterality Date   ANKLE FUSION Left 08/06/2019   Procedure: LEFT TALONAVICULAR AND SUBTALAR FUSION;  Surgeon: Newt Minion, MD;  Location: [facility];  Service: Orthopedics;  Laterality: Left;   COLONOSCOPY     Social History   Occupational History   Not on file  Tobacco Use   Smoking status: Never   Smokeless tobacco: Never  Vaping Use   Vaping Use: Never used  Substance and Sexual Activity   Alcohol use: Not Currently   Drug use: Never   Sexual activity: Not on file

## 2022-03-12 ENCOUNTER — Other Ambulatory Visit: Payer: BC Managed Care – PPO

## 2022-03-29 ENCOUNTER — Ambulatory Visit
Admission: RE | Admit: 2022-03-29 | Discharge: 2022-03-29 | Disposition: A | Discharge status: Home / Self Care | Payer: No Typology Code available for payment source | Source: Ambulatory Visit | Attending: Obstetrics and Gynecology | Admitting: Obstetrics and Gynecology

## 2022-03-29 ENCOUNTER — Ambulatory Visit: Payer: BC Managed Care – PPO

## 2022-03-29 DIAGNOSIS — M76822 Posterior tibial tendinitis, left leg: Secondary | ICD-10-CM

## 2022-03-29 DIAGNOSIS — Z8249 Family history of ischemic heart disease and other diseases of the circulatory system: Secondary | ICD-10-CM

## 2022-03-29 NOTE — Progress Notes (Signed)
Patient came in today for a nurse visit to have a uric acid level checked. This has been obtained and patient tolerated well. We will call her when we get those results back.

## 2022-03-30 ENCOUNTER — Other Ambulatory Visit: Payer: Self-pay | Admitting: Orthopedic Surgery

## 2022-03-30 LAB — URIC ACID: Uric Acid, Serum: 8 mg/dL — ABNORMAL HIGH (ref 2.5–7.0)

## 2022-03-30 MED ORDER — ALLOPURINOL 100 MG PO TABS: 100.0000 mg | ORAL_TABLET | Freq: Every day | ORAL | 3 refills | Status: AC

## 2022-03-30 MED ORDER — COLCHICINE 0.6 MG PO CAPS: 0.6000 mg | ORAL_CAPSULE | Freq: Every day | ORAL | 3 refills | Status: DC | PRN

## 2022-04-01 ENCOUNTER — Telehealth: Payer: Self-pay | Admitting: Orthopedic Surgery

## 2022-04-01 NOTE — Telephone Encounter (Signed)
From: Newt Minion, MD  Sent: 03/30/2022  10:51 AM EST  To: Pamella Pert, RMA   I sent in prescriptions this weekend for her.  We will need to see her back in a month or 2 to repeat lab work.

## 2022-04-03 NOTE — Telephone Encounter (Signed)
Pt replied back. She has her meds and taking them as prescribed. Feeling better, swelling going down. She will call for an appt.

## 2022-04-03 NOTE — Telephone Encounter (Signed)
Sent pt mychart message that Dr. Sharol Given wants to see her back in office in two months.

## 2022-04-23 ENCOUNTER — Ambulatory Visit: Payer: BC Managed Care – PPO | Admitting: Cardiology

## 2022-04-24 ENCOUNTER — Ambulatory Visit: Payer: BC Managed Care – PPO | Admitting: Cardiology

## 2022-04-30 ENCOUNTER — Ambulatory Visit: Payer: BC Managed Care – PPO | Admitting: Cardiology

## 2022-04-30 ENCOUNTER — Encounter: Payer: Self-pay | Admitting: Cardiology

## 2022-04-30 VITALS — BP 118/79 | HR 78 | Ht 60.0 in | Wt 219.0 lb

## 2022-04-30 DIAGNOSIS — E7800 Pure hypercholesterolemia, unspecified: Secondary | ICD-10-CM

## 2022-04-30 DIAGNOSIS — R931 Abnormal findings on diagnostic imaging of heart and coronary circulation: Secondary | ICD-10-CM

## 2022-04-30 MED ORDER — ATORVASTATIN CALCIUM 10 MG PO TABS: 10.0000 mg | ORAL_TABLET | Freq: Every day | ORAL | 3 refills | Status: AC

## 2022-04-30 NOTE — Progress Notes (Signed)
Primary Physician/Referring:  Chesley Noon, MD  Patient ID: [name], female    DOB: 03/19/1965, 57 y.o.   MRN: [id number]  Chief Complaint  Patient presents with   Coronary atherosclerosis due to calcified coronary lesion   New Patient (Initial Visit)   HPI:    Carrie Caldwell  is a 57 y.o. 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  She remains asymptomatic and exercises 4 days a week in the gym without dyspnea or chest pain or palpitations.  Continues to battle morbid obesity.  Past Medical History:  Diagnosis Date   Anemia    low iron in the past   Cystic kidney disease    left   History of kidney stones    Microscopic hematuria    Past Surgical History:  Procedure Laterality Date   ANKLE FUSION Left 08/06/2019   Procedure: LEFT TALONAVICULAR AND SUBTALAR FUSION;  Surgeon: Newt Minion, MD;  Location: [facility];  Service: Orthopedics;  Laterality: Left;   COLONOSCOPY     Family History  Problem Relation Age of Onset   Heart failure Father    Diabetes Other    Coronary artery disease Other    Kidney disease Other    Hematuria Other     Social History   Tobacco Use   Smoking status: Never   Smokeless tobacco: Never  Substance Use Topics   Alcohol use: Not Currently   Marital Status: Single  ROS  Review of Systems  Cardiovascular:  Negative for chest pain, dyspnea on exertion and leg swelling.   Objective      04/30/2022    8:58 AM 11/25/2019    9:03 AM 10/28/2019    8:43 AM  Vitals with BMI  Height 5\' 0"  5\' 0"  5\' 0"   Weight 219 lbs 210 lbs 210 lbs  BMI 42.77 0000000 0000000  Systolic 123456    Diastolic 79    Pulse 78     SpO2: 94 %   Physical Exam Constitutional:      Appearance: She is morbidly obese.  Neck:     Vascular: No carotid bruit or JVD.  Cardiovascular:     Rate and  Rhythm: Normal rate and regular rhythm.     Pulses: Intact distal pulses.     Heart sounds: Normal heart sounds. No murmur heard.    No gallop.  Pulmonary:     Effort: Pulmonary effort is normal.     Breath sounds: Normal breath sounds.  Abdominal:     General: Bowel sounds are normal.     Palpations: Abdomen is soft.  Musculoskeletal:     Right lower leg: No edema.     Left lower leg: No edema.    Laboratory examination:   External labs:   Labs 02/28/2022:  Hb 11.2/HCT 33.8, platelets 320.  Serum glucose 94 mg, BUN 21, creatinine 0.72, EGFR 97 mL, potassium 4.6, LFTs normal.  Labs 11/19/2016:  Total cholesterol 201, triglycerides 134, HDL 51, LDL 123.  Radiology:    Cardiac Studies:   Coronary calcium score 03/29/2022: LM 0 LAD 0 LCx 0 RCA 0.9 Total Agatston score 0.9, [place] database percentile 70.  Ascending descending aortic measurements are normal.  EKG:   EKG 04/30/2022: Normal sinus rhythm at rate of 69 bpm, normal EKG.   Medications and allergies  No Known Allergies   Medication  list   Current Outpatient Medications:    allopurinol (ZYLOPRIM) 100 MG tablet, Take 1 tablet (100 mg total) by mouth daily., Disp: 30 tablet, Rfl: 3   atorvastatin (LIPITOR) 10 MG tablet, Take 1 tablet (10 mg total) by mouth daily., Disp: 90 tablet, Rfl: 3  Assessment     ICD-10-CM   1. Elevated coronary artery calcium score 03/29/2022: Total Agatston score 0.9, [place] database percentile 70.  R93.1 EKG 12-Lead    Lipid Panel With LDL/HDL Ratio    2. Mild hypercholesterolemia  E78.00 atorvastatin (LIPITOR) 10 MG tablet    Lipid Panel With LDL/HDL Ratio       Orders Placed This Encounter  Procedures   Lipid Panel With LDL/HDL Ratio   EKG 12-Lead    Meds ordered this encounter  Medications   atorvastatin (LIPITOR) 10 MG tablet    Sig: Take 1 tablet (10 mg total) by mouth daily.    Dispense:  90 tablet    Refill:  3    Medications Discontinued During This Encounter   Medication Reason   Colchicine 0.6 MG CAPS      Recommendations:   Carrie Caldwell is a 57 y.o.  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 Arvella Nigh for cardiac risk stratification.  She remains asymptomatic.  1. Elevated coronary artery calcium score 03/29/2022: Total Agatston score 0.9, [place] database percentile 70. Patient's coronary calcium score is moderately elevated at 70 percentile, I discussed regarding primary prevention, will start her on Lipitor 10 mg daily, obtain lipids in about 2 to 3 months.  No aspirin is indicated. - EKG 12-Lead - Lipid Panel With LDL/HDL Ratio  2. Mild hypercholesterolemia As dictated above, has mild hypercholesterolemia, 10 mg of Lipitor started.  Patient has morbid obesity, she is having a hard time trying to lose weight due to her job, fortunately she still continues to exercise 4 days a week with the help of a trainer.  Physical examination except for morbid obesity is completely normal without any murmur or bruit, she has excellent pedal pulses.  Femoral pulses were difficult to feel due to body habitus.  I will see her back on a as needed basis. - atorvastatin (LIPITOR) 10 MG tablet; Take 1 tablet (10 mg total) by mouth daily.  Dispense: 90 tablet; Refill: 3 - Lipid Panel With LDL/HDL Ratio     Adrian Prows, MD, [facility] 04/30/2022, 9:43 AM Office: [phone]

## 2022-05-22 ENCOUNTER — Other Ambulatory Visit: Payer: Self-pay

## 2022-05-22 ENCOUNTER — Encounter: Payer: Self-pay | Admitting: Family

## 2022-05-22 ENCOUNTER — Ambulatory Visit: Payer: BC Managed Care – PPO | Admitting: Family

## 2022-05-22 DIAGNOSIS — M1A0720 Idiopathic chronic gout, left ankle and foot, without tophus (tophi): Secondary | ICD-10-CM

## 2022-05-22 NOTE — Progress Notes (Signed)
Patient seen for uric acid check

## 2022-05-23 LAB — URIC ACID: Uric Acid, Serum: 7 mg/dL (ref 2.5–7.0)

## 2022-05-24 NOTE — Progress Notes (Signed)
Uric acid wnl - at 7

## 2022-05-26 ENCOUNTER — Other Ambulatory Visit: Payer: Self-pay | Admitting: Orthopedic Surgery

## 2022-05-26 MED ORDER — FEBUXOSTAT 80 MG PO TABS
80.0000 mg | ORAL_TABLET | Freq: Every day | ORAL | 3 refills | Status: DC
Start: 2022-05-26 — End: 2022-08-29

## 2022-06-13 ENCOUNTER — Ambulatory Visit: Payer: BC Managed Care – PPO | Admitting: Orthopedic Surgery

## 2022-06-13 DIAGNOSIS — M1A0720 Idiopathic chronic gout, left ankle and foot, without tophus (tophi): Secondary | ICD-10-CM | POA: Diagnosis not present

## 2022-06-14 ENCOUNTER — Telehealth: Payer: Self-pay

## 2022-06-14 LAB — URIC ACID: Uric Acid, Serum: 3 mg/dL (ref 2.5–7.0)

## 2022-06-14 NOTE — Telephone Encounter (Signed)
-----   Message from Nadara Mustard, MD sent at 06/14/2022  6:38 AM EDT ----- Call cindy, uric acid 3, in normal range, lets continue the 80 mg dose for this one bottle and then decrease to 40 mg tablets ----- Message ----- From: Janace Hoard Lab Results In Sent: 06/14/2022   1:14 AM EDT To: Nadara Mustard, MD

## 2022-06-14 NOTE — Telephone Encounter (Signed)
I called and advised pt of message below.  

## 2022-06-14 NOTE — Telephone Encounter (Signed)
I called pt to advise of the lab results and she states that when she picked up the rx they gave her 3 bottles ( 3 month supply) is it ok for the pt to cut the pills in half for the 40 mg dose? And if she when would you like her to start that?

## 2022-06-24 ENCOUNTER — Encounter: Payer: Self-pay | Admitting: Orthopedic Surgery

## 2022-06-24 NOTE — Progress Notes (Signed)
Office Visit Note   Patient: Carrie Caldwell           Date of Birth: 1965-11-15           MRN: [id number] Visit Date: 06/13/2022              Requested by: Eartha Inch, MD [address] [place],  [place] [address] PCP: Eartha Inch, MD  Chief Complaint  Patient presents with   Left Foot - Pain      HPI: Patient is a 57 year old woman status post subtalar and talonavicular fusion with elevated uric acid with hindfoot pain.  Patient recently was prescribed Uloric 80 mg on April 21.  Assessment & Plan: Visit Diagnoses:  1. Idiopathic chronic gout of left foot without tophus     Plan: Repeat uric acid level today adjust gout medicine as needed.  Continue Uloric at 80 mg a day.  Will recheck in 3 months.  Follow-Up Instructions: No follow-ups on file.   Ortho Exam  Patient is alert, oriented, no adenopathy, well-dressed, normal affect, normal respiratory effort. Examination patient has pain at the base of the fifth metatarsal left foot there is no redness there is no pain over the peroneal tendons the subtalar joint is not tender.  Uric acid today is 3.0.  Imaging: No results found. No images are attached to the encounter.  Labs: Lab Results  Component Value Date   ESRSEDRATE 36 (H) 07/20/2018   LABURIC 3.0 06/13/2022   LABURIC 7.0 05/22/2022   LABURIC 8.0 (H) 03/29/2022     Lab Results  Component Value Date   ALBUMIN 4.2 08/14/2009   ALBUMIN 3.9 08/01/2008    No results found for: "MG" No results found for: "VD25OH"  No results found for: "PREALBUMIN"    Latest Ref Rng & Units 08/06/2019    5:54 AM 08/14/2009    8:48 AM 08/01/2008    8:16 AM  CBC EXTENDED  WBC 4.0 - 10.5 K/uL 8.2  7.0  9.3   RBC 3.87 - 5.11 MIL/uL 4.13  4.26  4.02   Hemoglobin 12.0 - 15.0 g/dL 78.2  95.6  21.3   HCT 36.0 - 46.0 % 35.7  36.5  34.7   Platelets 150 - 400 K/uL 318  285.0  292.0   NEUT# 1.4 - 7.7 K/uL  4.2  6.7   Lymph# 0.7 - 4.0 K/uL  2.2  1.7       There is no height or weight on file to calculate BMI.  Orders:  Orders Placed This Encounter  Procedures   Uric acid   No orders of the defined types were placed in this encounter.    Procedures: No procedures performed  Clinical Data: No additional findings.  ROS:  All other systems negative, except as noted in the HPI. Review of Systems  Objective: Vital Signs: LMP 03/08/2011   Specialty Comments:  No specialty comments available.  PMFS History: Patient Active Problem List   Diagnosis Date Noted   H/O ankle fusion 08/13/2019   Posterior tibial tendon dysfunction, left    Mass of soft tissue of foot 07/20/2018   OME (otitis media with effusion) 04/27/2011   RENAL CYST, LEFT 08/21/2009   OBESITY 08/16/2008   CHEST WALL PAIN, HX OF 04/05/2008   MICROSCOPIC HEMATURIA 05/08/2007   Past Medical History:  Diagnosis Date   Anemia    low iron in the past   Cystic kidney disease    left  History of kidney stones    Microscopic hematuria     Family History  Problem Relation Age of Onset   Heart failure Father    Diabetes Other    Coronary artery disease Other    Kidney disease Other    Hematuria Other     Past Surgical History:  Procedure Laterality Date   ANKLE FUSION Left 08/06/2019   Procedure: LEFT TALONAVICULAR AND SUBTALAR FUSION;  Surgeon: Nadara Mustard, MD;  Location: [facility] OR;  Service: Orthopedics;  Laterality: Left;   COLONOSCOPY     Social History   Occupational History   Not on file  Tobacco Use   Smoking status: Never   Smokeless tobacco: Never  Vaping Use   Vaping Use: Never used  Substance and Sexual Activity   Alcohol use: Not Currently   Drug use: Never   Sexual activity: Not on file

## 2022-08-29 ENCOUNTER — Other Ambulatory Visit: Payer: Self-pay | Admitting: Orthopedic Surgery

## 2022-08-29 MED ORDER — FEBUXOSTAT 80 MG PO TABS: 80.0000 mg | ORAL_TABLET | Freq: Every day | ORAL | 3 refills | Status: AC

## 2023-06-26 ENCOUNTER — Other Ambulatory Visit: Payer: Self-pay | Admitting: Orthopedic Surgery

## 2023-06-26 MED ORDER — FEBUXOSTAT 80 MG PO TABS: 80.0000 mg | ORAL_TABLET | Freq: Every day | ORAL | 3 refills | Status: AC

## 2023-12-08 ENCOUNTER — Encounter: Payer: Self-pay | Admitting: Radiology
